# Patient Record
Sex: Female | Born: 1965 | Race: Black or African American | Hispanic: No | Marital: Single | Smoking: Former smoker
Health system: Southern US, Community
[De-identification: ages and names within clinical notes are randomized; demographics above are authoritative.]

## PROBLEM LIST (undated history)

## (undated) DIAGNOSIS — K3580 Unspecified acute appendicitis: Secondary | ICD-10-CM

---

## 2005-08-14 ENCOUNTER — Emergency Department (HOSPITAL_COMMUNITY): Admission: EM | Admit: 2005-08-14 | Discharge: 2005-08-14 | Payer: Self-pay | Admitting: Emergency Medicine

## 2005-11-04 ENCOUNTER — Ambulatory Visit (HOSPITAL_COMMUNITY): Admission: RE | Admit: 2005-11-04 | Discharge: 2005-11-04 | Payer: Self-pay | Admitting: Obstetrics

## 2005-11-09 ENCOUNTER — Ambulatory Visit (HOSPITAL_COMMUNITY): Admission: RE | Admit: 2005-11-09 | Discharge: 2005-11-09 | Payer: Self-pay | Admitting: Obstetrics

## 2005-11-18 ENCOUNTER — Encounter: Admission: RE | Admit: 2005-11-18 | Discharge: 2005-11-18 | Payer: Self-pay | Admitting: Obstetrics

## 2007-02-22 ENCOUNTER — Emergency Department (HOSPITAL_COMMUNITY): Admission: EM | Admit: 2007-02-22 | Discharge: 2007-02-22 | Payer: Self-pay | Admitting: Emergency Medicine

## 2008-08-29 ENCOUNTER — Emergency Department (HOSPITAL_COMMUNITY): Admission: EM | Admit: 2008-08-29 | Discharge: 2008-08-29 | Payer: Self-pay | Admitting: Emergency Medicine

## 2009-10-01 ENCOUNTER — Emergency Department (HOSPITAL_COMMUNITY): Admission: EM | Admit: 2009-10-01 | Discharge: 2009-10-01 | Payer: Self-pay | Admitting: Emergency Medicine

## 2009-12-13 ENCOUNTER — Emergency Department (HOSPITAL_COMMUNITY)
Admission: EM | Admit: 2009-12-13 | Discharge: 2009-12-13 | Payer: Self-pay | Source: Home / Self Care | Admitting: Emergency Medicine

## 2010-11-24 ENCOUNTER — Other Ambulatory Visit: Payer: Self-pay | Admitting: Obstetrics

## 2010-11-24 DIAGNOSIS — Z1231 Encounter for screening mammogram for malignant neoplasm of breast: Secondary | ICD-10-CM

## 2010-11-26 ENCOUNTER — Other Ambulatory Visit: Payer: Self-pay | Admitting: Gynecology

## 2010-11-26 DIAGNOSIS — Z1231 Encounter for screening mammogram for malignant neoplasm of breast: Secondary | ICD-10-CM

## 2010-12-17 ENCOUNTER — Ambulatory Visit
Admission: RE | Admit: 2010-12-17 | Discharge: 2010-12-17 | Disposition: A | Discharge status: Home / Self Care | Payer: BC Managed Care – PPO | Source: Ambulatory Visit | Attending: Gynecology | Admitting: Gynecology

## 2010-12-17 DIAGNOSIS — Z1231 Encounter for screening mammogram for malignant neoplasm of breast: Secondary | ICD-10-CM

## 2011-03-16 ENCOUNTER — Encounter: Payer: Self-pay | Admitting: Gynecology

## 2011-03-16 ENCOUNTER — Ambulatory Visit (INDEPENDENT_AMBULATORY_CARE_PROVIDER_SITE_OTHER): Payer: BC Managed Care – PPO | Admitting: Gynecology

## 2011-03-16 VITALS — BP 126/74 | Ht 63.0 in | Wt 217.0 lb

## 2011-03-16 DIAGNOSIS — N946 Dysmenorrhea, unspecified: Secondary | ICD-10-CM

## 2011-03-16 DIAGNOSIS — N920 Excessive and frequent menstruation with regular cycle: Secondary | ICD-10-CM

## 2011-03-16 DIAGNOSIS — D259 Leiomyoma of uterus, unspecified: Secondary | ICD-10-CM

## 2011-03-16 MED ORDER — CLINDAMYCIN PHOSPHATE 2 % VA CREA: 1.0000 | TOPICAL_CREAM | Freq: Every day | VAGINAL | Status: AC

## 2011-03-16 NOTE — Progress Notes (Addendum)
April Reed is an 46 y.o. female. Referred to our practice as a courtesy of Dr. Nicholas Lose. 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 Her symptoms have been heavy periods tiredness fatigue and pelvic pressure. She has had fibroids since 1992 and recently her symptoms of worsen that her periods last approximately 10 days at least and she has to be changing pads every 2 hours. Her recent hemoglobin and hematocrit on January 31 was as follows: Hemoglobin 13.6 and hematocrit 40.9 with a platelet count of 309,000. She had a recent FSH normal at 6.9. GC and Chlamydia culture done October of 2012 normal. Pap smear October 2012 normal with exception of yeast. Last mammogram November 2012 normal patient was treated in November for bacterial vaginosis. Several weeks ago by Dr. Nicholas Lose office she was 3 screening there was no evidence of BV.  Ultrasound done at Dr. Nicholas Lose office had demonstrated a fibroid uterus with transmural fibroid 1 with grade 3 submucous extending well into endometrial cavity and she had multiple small fibroids both intramural and serosal. He was unsuccessful and attempting a sonohysterogram do to the submucous myoma encroaching into the intrauterine cavity. She is here today for preoperative consultation for planned hysterectomy.    Pertinent Gynecological History: Menses: Heavy periods lasting up to 7-10 days changing pads every 2 hours Bleeding: As above Contraception: none DES exposure: denies Blood transfusions: none Sexually transmitted diseases: no past history Previous GYN Procedures: C-section  Last mammogram: normal Date: 2012 Last pap: normal Date: 2012 OB History: G3, P2 Ab1   Menstrual History: Menarche age: 9 No LMP recorded. Period Cycle (Days): 24  (24-28) Period Duration (Days): 7-10 Period Pattern: Regular Menstrual Flow: Heavy Menstrual Control: Maxi pad Menstrual Control Change Freq (Hours): q27mins on the heaviest  day Dysmenorrhea: Severe Dysmenorrhea Symptoms: Cramping;Headache (achiness before period )  History reviewed. No pertinent past medical history.  Past Surgical History  Procedure Date  . Cesarean section 1989    Family History  Problem Relation Age of Onset  . Hypertension Maternal Grandmother   . Diabetes Other     Social History:  reports that she has quit smoking. She has never used smokeless tobacco. She reports that she drinks alcohol. She reports that she does not use illicit drugs.  Allergies:  Allergies  Allergen Reactions  . Latex      (Not in a hospital admission)  @ROS @  Blood pressure 126/74, height 5\' 3"  (1.6 m), weight 217 lb (98.431 kg).  Physical Exam:  HEENT:unremarkable Neck:Supple, midline, no thyroid megaly, no carotid bruits Lungs:  Clear to auscultation no rhonchi's or wheezes Heart:Regular rate and rhythm, no murmurs or gallops Breast Exam: Not examined Abdomen: Soft nontender no rebound or guarding Pelvic:BUS within normal limits Vagina: No lesions or discharge Cervix: No lesions or discharge Uterus: 10 weeks size irregular shaped Adnexa: No palpable adnexal masses Extremities: No cords, no edema Rectal: Not done  No results found for this or any previous visit (from the past 24 hour(s)).  No results found.  Assessment/Plan: 46 year old gravida 3 para 2 AB 1 with symptomatic leiomyomatous uteri was counseled today for a total laparoscopic hysterectomy with ovarian conservation. The risks benefits and pros and cons of the operation were discussed to include the following: The risk of infection (prophylaxis antibiotic will be administered), the risk of DVT and subsequent pulmonary embolism (PAS stockings for prophylaxis will be administered), the risk of hemorrhaging and needing blood products or blood transfusion as the risk of anaphylactic reaction hepatitis  and AIDS. The risk of trauma or injury to internal structures requiring emergency  laparotomy and repair were discussed. Also in the event of any technical difficulty the laparoscopic approach may need to be converted to an open laparotomy procedure. If there is any abnormality of one or both ovaries which requires removal patient is full authorization but all efforts will be made to leave both ovaries. All the above were discussed in detail with the patient and literature information was provided on the procedure and all questions were answered. I have asked her to use Cleocin vaginal cream to apply intravaginally each bedtime for 5 days before her surgery.   FERNANDEZ,JUAN H 03/16/2011, 5:20 PM

## 2011-03-16 NOTE — Patient Instructions (Signed)
April Reed we'll be calling her tomorrow to let's know the date of the surgery. I would like you to use the Cleocin vaginal cream to apply it intravaginally for 5 nights before her surgery.

## 2011-03-19 ENCOUNTER — Encounter (HOSPITAL_COMMUNITY): Payer: Self-pay

## 2011-03-21 ENCOUNTER — Telehealth: Payer: Self-pay

## 2011-03-21 NOTE — Telephone Encounter (Signed)
Patient did not want to reschedule now. She is heading for [place] and said she how long she will stay there.   She has  asked me to cancel and she will call me upon her return to reschedule.

## 2011-03-21 NOTE — Telephone Encounter (Signed)
Please reschedule her TL H4 times a week and being for her as well. Please give her our condolences and tell her that our thoughts and prayers are with her and her family

## 2011-03-21 NOTE — Telephone Encounter (Signed)
Patient called and asked me to call her. She needs to reschedule her TLH that was recently scheduled for 03/31/2022. Her mother has died and she is on her way to [place].  I will call her to reschedule and send you new date.

## 2011-03-25 ENCOUNTER — Other Ambulatory Visit (HOSPITAL_COMMUNITY): Payer: BC Managed Care – PPO

## 2011-03-28 ENCOUNTER — Encounter (HOSPITAL_COMMUNITY): Admission: RE | Payer: Self-pay | Source: Ambulatory Visit

## 2011-03-28 ENCOUNTER — Ambulatory Visit (HOSPITAL_COMMUNITY): Admission: RE | Admit: 2011-03-28 | Payer: BC Managed Care – PPO | Source: Ambulatory Visit | Admitting: Gynecology

## 2011-03-28 SURGERY — HYSTERECTOMY, TOTAL, LAPAROSCOPIC
Anesthesia: General

## 2011-04-06 ENCOUNTER — Encounter (HOSPITAL_COMMUNITY)
Admission: RE | Admit: 2011-04-06 | Discharge: 2011-04-06 | Disposition: A | Discharge status: Home / Self Care | Payer: BC Managed Care – PPO | Source: Ambulatory Visit | Attending: Gynecology | Admitting: Gynecology

## 2011-04-06 ENCOUNTER — Encounter (HOSPITAL_COMMUNITY): Payer: Self-pay

## 2011-04-06 LAB — URINALYSIS, ROUTINE W REFLEX MICROSCOPIC
Ketones, ur: NEGATIVE mg/dL
Leukocytes, UA: NEGATIVE
Nitrite: NEGATIVE
Protein, ur: NEGATIVE mg/dL
Urobilinogen, UA: 0.2 mg/dL (ref 0.0–1.0)

## 2011-04-06 LAB — CBC
Hemoglobin: 13.8 g/dL (ref 12.0–15.0)
MCH: 30.8 pg (ref 26.0–34.0)
MCHC: 32.9 g/dL (ref 30.0–36.0)
Platelets: 306 10*3/uL (ref 150–400)
RDW: 12.9 % (ref 11.5–15.5)

## 2011-04-06 LAB — SURGICAL PCR SCREEN: MRSA, PCR: NEGATIVE

## 2011-04-06 NOTE — Patient Instructions (Addendum)
[name]  04/06/2011   Your procedure is scheduled on:  04/13/11  Enter through the Main Entrance of [facility] at 1130 AM.  Pick up the phone at the desk and dial [phone].   Call this number if you have problems the morning of surgery: [phone]   Remember:   Do not eat food:After Midnight.  Do not drink clear liquids: 4 Hours before arrival.  Take these medicines the morning of surgery with A SIP OF WATER: May take Xanax if needed   Do not wear jewelry, make-up or nail polish.  Do not wear lotions, powders, or perfumes. You may wear deodorant.  Do not shave 48 hours prior to surgery.  Do not bring valuables to the hospital.  Contacts, dentures or bridgework may not be worn into surgery.  Leave suitcase in the car. After surgery it may be brought to your room.  For patients admitted to the hospital, checkout time is 11:00 AM the day of discharge.   Patients discharged the day of surgery will not be allowed to drive home.  Name and phone number of your driver: NA  Special Instructions: CHG Shower Use Special Wash: 1/2 bottle night before surgery and 1/2 bottle morning of surgery.   Please read over the following fact sheets that you were given: MRSA Information

## 2011-04-12 ENCOUNTER — Telehealth: Payer: Self-pay

## 2011-04-12 MED ORDER — CEFAZOLIN SODIUM-DEXTROSE 2-3 GM-% IV SOLR
2.0000 g | INTRAVENOUS | Status: AC
  Administered 2011-04-13: 2 g via INTRAVENOUS
  Filled 2011-04-12: qty 50

## 2011-04-12 NOTE — Telephone Encounter (Signed)
Dr. Glenetta Hew asked me to call and see if any way patient could come over today and have u/s.  Her surgery is tomorrow and he realized her last scan was 2 mos ago and thought he would like to take another look at her fibroids.  Arna Medici she could fit her in at 12:30 or 1:00pm and I relayed this to the patient.  The patient said so much today at work today in order to be out for surgery that no way that she can leave today.  I told her Dr. Glenetta Hew said it would be okay if she could not. She cannot work it out.

## 2011-04-13 ENCOUNTER — Ambulatory Visit (HOSPITAL_COMMUNITY): Payer: BC Managed Care – PPO | Admitting: Anesthesiology

## 2011-04-13 ENCOUNTER — Encounter (HOSPITAL_COMMUNITY): Payer: Self-pay | Admitting: *Deleted

## 2011-04-13 ENCOUNTER — Encounter (HOSPITAL_COMMUNITY): Payer: Self-pay | Admitting: Anesthesiology

## 2011-04-13 ENCOUNTER — Encounter (HOSPITAL_COMMUNITY)
Admission: RE | Disposition: A | Discharge status: Home / Self Care | Payer: Self-pay | Source: Ambulatory Visit | Attending: Gynecology

## 2011-04-13 ENCOUNTER — Inpatient Hospital Stay (HOSPITAL_COMMUNITY)
Admission: RE | Admit: 2011-04-13 | Discharge: 2011-04-14 | DRG: 359 | Disposition: A | Discharge status: Home / Self Care | Payer: BC Managed Care – PPO | Source: Ambulatory Visit | Attending: Gynecology | Admitting: Gynecology

## 2011-04-13 DIAGNOSIS — N8000 Endometriosis of the uterus, unspecified: Secondary | ICD-10-CM | POA: Diagnosis present

## 2011-04-13 DIAGNOSIS — D259 Leiomyoma of uterus, unspecified: Secondary | ICD-10-CM

## 2011-04-13 DIAGNOSIS — Z01812 Encounter for preprocedural laboratory examination: Secondary | ICD-10-CM

## 2011-04-13 DIAGNOSIS — N840 Polyp of corpus uteri: Secondary | ICD-10-CM | POA: Diagnosis present

## 2011-04-13 DIAGNOSIS — N946 Dysmenorrhea, unspecified: Secondary | ICD-10-CM | POA: Diagnosis present

## 2011-04-13 DIAGNOSIS — Z01818 Encounter for other preprocedural examination: Secondary | ICD-10-CM

## 2011-04-13 DIAGNOSIS — N920 Excessive and frequent menstruation with regular cycle: Secondary | ICD-10-CM

## 2011-04-13 HISTORY — PX: LAPAROSCOPIC HYSTERECTOMY: SHX1926

## 2011-04-13 LAB — PREGNANCY, URINE: Preg Test, Ur: NEGATIVE

## 2011-04-13 SURGERY — HYSTERECTOMY, TOTAL, LAPAROSCOPIC
Anesthesia: General | Site: Abdomen | Wound class: Clean Contaminated

## 2011-04-13 MED ORDER — ONDANSETRON HCL 4 MG/2ML IJ SOLN
INTRAMUSCULAR | Status: DC | PRN
  Administered 2011-04-13: 4 mg via INTRAVENOUS

## 2011-04-13 MED ORDER — DIPHENHYDRAMINE HCL 50 MG/ML IJ SOLN: 12.5000 mg | Freq: Four times a day (QID) | INTRAMUSCULAR | Status: DC | PRN

## 2011-04-13 MED ORDER — GLYCOPYRROLATE 0.2 MG/ML IJ SOLN
INTRAMUSCULAR | Status: DC | PRN
  Administered 2011-04-13: 0.6 mg via INTRAVENOUS

## 2011-04-13 MED ORDER — MORPHINE SULFATE (PF) 1 MG/ML IV SOLN
INTRAVENOUS | Status: DC
  Administered 2011-04-13: 16.9 mg via INTRAVENOUS
  Administered 2011-04-13: 17:00:00 via INTRAVENOUS
  Administered 2011-04-14: 13.5 mg via INTRAVENOUS
  Administered 2011-04-14: 8.71 mg via INTRAVENOUS
  Administered 2011-04-14: 01:00:00 via INTRAVENOUS
  Filled 2011-04-13 (×2): qty 25

## 2011-04-13 MED ORDER — KETOROLAC TROMETHAMINE 60 MG/2ML IM SOLN
INTRAMUSCULAR | Status: AC
  Filled 2011-04-13: qty 2

## 2011-04-13 MED ORDER — BUPIVACAINE HCL (PF) 0.25 % IJ SOLN
INTRAMUSCULAR | Status: DC | PRN
  Administered 2011-04-13: 10 mL

## 2011-04-13 MED ORDER — FENTANYL CITRATE 0.05 MG/ML IJ SOLN
INTRAMUSCULAR | Status: AC
  Filled 2011-04-13: qty 2

## 2011-04-13 MED ORDER — PHENYLEPHRINE 40 MCG/ML (10ML) SYRINGE FOR IV PUSH (FOR BLOOD PRESSURE SUPPORT)
PREFILLED_SYRINGE | INTRAVENOUS | Status: AC
  Filled 2011-04-13: qty 5

## 2011-04-13 MED ORDER — DEXAMETHASONE SODIUM PHOSPHATE 4 MG/ML IJ SOLN
INTRAMUSCULAR | Status: DC | PRN
  Administered 2011-04-13: 10 mg via INTRAVENOUS

## 2011-04-13 MED ORDER — DIPHENHYDRAMINE HCL 12.5 MG/5ML PO ELIX: 12.5000 mg | ORAL_SOLUTION | Freq: Four times a day (QID) | ORAL | Status: DC | PRN

## 2011-04-13 MED ORDER — PROPOFOL 10 MG/ML IV EMUL
INTRAVENOUS | Status: DC | PRN
  Administered 2011-04-13: 200 mg via INTRAVENOUS

## 2011-04-13 MED ORDER — GLYCOPYRROLATE 0.2 MG/ML IJ SOLN
INTRAMUSCULAR | Status: AC
  Filled 2011-04-13: qty 1

## 2011-04-13 MED ORDER — ONDANSETRON HCL 4 MG/2ML IJ SOLN: 4.0000 mg | Freq: Four times a day (QID) | INTRAMUSCULAR | Status: DC | PRN

## 2011-04-13 MED ORDER — KETOROLAC TROMETHAMINE 30 MG/ML IJ SOLN
INTRAMUSCULAR | Status: DC | PRN
  Administered 2011-04-13: 30 mg via INTRAVENOUS

## 2011-04-13 MED ORDER — LACTATED RINGERS IV SOLN
INTRAVENOUS | Status: DC
  Administered 2011-04-13 – 2011-04-14 (×2): via INTRAVENOUS

## 2011-04-13 MED ORDER — HYDROMORPHONE HCL PF 1 MG/ML IJ SOLN
0.2500 mg | INTRAMUSCULAR | Status: DC | PRN
  Administered 2011-04-13 (×3): 0.5 mg via INTRAVENOUS

## 2011-04-13 MED ORDER — LIDOCAINE HCL (CARDIAC) 20 MG/ML IV SOLN
INTRAVENOUS | Status: AC
  Filled 2011-04-13: qty 5

## 2011-04-13 MED ORDER — LACTATED RINGERS IR SOLN
Status: DC | PRN
  Administered 2011-04-13: 3000 mL

## 2011-04-13 MED ORDER — LIDOCAINE HCL (CARDIAC) 20 MG/ML IV SOLN
INTRAVENOUS | Status: DC | PRN
  Administered 2011-04-13: 100 mg via INTRAVENOUS

## 2011-04-13 MED ORDER — SODIUM CHLORIDE 0.9 % IJ SOLN: 9.0000 mL | INTRAMUSCULAR | Status: DC | PRN

## 2011-04-13 MED ORDER — DEXAMETHASONE SODIUM PHOSPHATE 10 MG/ML IJ SOLN
INTRAMUSCULAR | Status: AC
  Filled 2011-04-13: qty 1

## 2011-04-13 MED ORDER — HYDROMORPHONE HCL PF 1 MG/ML IJ SOLN
INTRAMUSCULAR | Status: AC
  Filled 2011-04-13: qty 1

## 2011-04-13 MED ORDER — MIDAZOLAM HCL 5 MG/5ML IJ SOLN
INTRAMUSCULAR | Status: DC | PRN
  Administered 2011-04-13: 2 mg via INTRAVENOUS

## 2011-04-13 MED ORDER — INDIGOTINDISULFONATE SODIUM 8 MG/ML IJ SOLN
INTRAMUSCULAR | Status: AC
  Filled 2011-04-13: qty 5

## 2011-04-13 MED ORDER — PHENYLEPHRINE HCL 10 MG/ML IJ SOLN
INTRAMUSCULAR | Status: DC | PRN
  Administered 2011-04-13: .4 mg via INTRAVENOUS

## 2011-04-13 MED ORDER — NEOSTIGMINE METHYLSULFATE 1 MG/ML IJ SOLN
INTRAMUSCULAR | Status: AC
  Filled 2011-04-13: qty 10

## 2011-04-13 MED ORDER — FENTANYL CITRATE 0.05 MG/ML IJ SOLN
INTRAMUSCULAR | Status: DC | PRN
  Administered 2011-04-13: 50 ug via INTRAVENOUS
  Administered 2011-04-13: 25 ug via INTRAVENOUS
  Administered 2011-04-13: 100 ug via INTRAVENOUS
  Administered 2011-04-13 (×3): 50 ug via INTRAVENOUS
  Administered 2011-04-13: 25 ug via INTRAVENOUS
  Administered 2011-04-13 (×2): 50 ug via INTRAVENOUS

## 2011-04-13 MED ORDER — ROCURONIUM BROMIDE 100 MG/10ML IV SOLN
INTRAVENOUS | Status: DC | PRN
  Administered 2011-04-13: 10 mg via INTRAVENOUS
  Administered 2011-04-13: 40 mg via INTRAVENOUS

## 2011-04-13 MED ORDER — HYDROMORPHONE HCL PF 1 MG/ML IJ SOLN
INTRAMUSCULAR | Status: AC
  Administered 2011-04-13: 0.5 mg via INTRAVENOUS
  Filled 2011-04-13: qty 1

## 2011-04-13 MED ORDER — LACTATED RINGERS IV SOLN
INTRAVENOUS | Status: DC
  Administered 2011-04-13 (×3): via INTRAVENOUS

## 2011-04-13 MED ORDER — BUPIVACAINE HCL (PF) 0.25 % IJ SOLN
INTRAMUSCULAR | Status: AC
  Filled 2011-04-13: qty 30

## 2011-04-13 MED ORDER — ONDANSETRON HCL 4 MG/2ML IJ SOLN
INTRAMUSCULAR | Status: AC
  Filled 2011-04-13: qty 2

## 2011-04-13 MED ORDER — NEOSTIGMINE METHYLSULFATE 1 MG/ML IJ SOLN
INTRAMUSCULAR | Status: DC | PRN
  Administered 2011-04-13: 4 mg via INTRAVENOUS

## 2011-04-13 MED ORDER — NALOXONE HCL 0.4 MG/ML IJ SOLN: 0.4000 mg | INTRAMUSCULAR | Status: DC | PRN

## 2011-04-13 MED ORDER — MIDAZOLAM HCL 2 MG/2ML IJ SOLN
INTRAMUSCULAR | Status: AC
  Filled 2011-04-13: qty 2

## 2011-04-13 MED ORDER — FENTANYL CITRATE 0.05 MG/ML IJ SOLN
INTRAMUSCULAR | Status: AC
  Filled 2011-04-13: qty 5

## 2011-04-13 MED ORDER — KETOROLAC TROMETHAMINE 30 MG/ML IJ SOLN: 15.0000 mg | Freq: Once | INTRAMUSCULAR | Status: DC | PRN

## 2011-04-13 SURGICAL SUPPLY — 55 items
BLADE SURG 15 STRL LF C SS BP (BLADE) ×1 IMPLANT
BLADE SURG 15 STRL SS (BLADE) ×1
CABLE HIGH FREQUENCY MONO STRZ (ELECTRODE) ×2 IMPLANT
CLOTH BEACON ORANGE TIMEOUT ST (SAFETY) ×2 IMPLANT
CONT PATH 16OZ SNAP LID 3702 (MISCELLANEOUS) IMPLANT
COVER MAYO STAND STRL (DRAPES) ×2 IMPLANT
COVER TABLE BACK 60X90 (DRAPES) ×2 IMPLANT
DERMABOND ADVANCED (GAUZE/BANDAGES/DRESSINGS) ×1
DERMABOND ADVANCED .7 DNX12 (GAUZE/BANDAGES/DRESSINGS) ×1 IMPLANT
DEVICE SUTURE ENDOST 10MM (ENDOMECHANICALS) ×10 IMPLANT
DISSECTOR BLUNT TIP ENDO 5MM (MISCELLANEOUS) IMPLANT
DRAPE CAMERA CLOSED 9X96 (DRAPES) IMPLANT
ENDOSTITCH 0 SINGLE 48 (SUTURE) ×6 IMPLANT
EVACUATOR SMOKE 8.L (FILTER) ×2 IMPLANT
GLOVE BIO SURGEON STRL SZ7.5 (GLOVE) ×2 IMPLANT
GLOVE BIOGEL PI IND STRL 6.5 (GLOVE) IMPLANT
GLOVE BIOGEL PI IND STRL 8 (GLOVE) ×1 IMPLANT
GLOVE BIOGEL PI INDICATOR 6.5 (GLOVE)
GLOVE BIOGEL PI INDICATOR 8 (GLOVE) ×1
GLOVE ECLIPSE 7.5 STRL STRAW (GLOVE) ×4 IMPLANT
GOWN PREVENTION PLUS LG XLONG (DISPOSABLE) ×4 IMPLANT
NS IRRIG 1000ML POUR BTL (IV SOLUTION) ×2 IMPLANT
OCCLUDER COLPOPNEUMO (BALLOONS) ×2 IMPLANT
PACK LAVH (CUSTOM PROCEDURE TRAY) ×2 IMPLANT
SCALPEL HARMONIC ACE (MISCELLANEOUS) ×2 IMPLANT
SCISSORS LAP 5X35 DISP (ENDOMECHANICALS) ×2 IMPLANT
SET CYSTO W/LG BORE CLAMP LF (SET/KITS/TRAYS/PACK) IMPLANT
SET IRRIG TUBING LAPAROSCOPIC (IRRIGATION / IRRIGATOR) ×2 IMPLANT
SUT PLAIN 3 0 PS2 27 (SUTURE) IMPLANT
SUT VIC AB 0 CT1 18XCR BRD8 (SUTURE) IMPLANT
SUT VIC AB 0 CT1 27 (SUTURE)
SUT VIC AB 0 CT1 27XBRD ANBCTR (SUTURE) IMPLANT
SUT VIC AB 0 CT1 8-18 (SUTURE)
SUT VIC AB 2-0 SH 27 (SUTURE)
SUT VIC AB 2-0 SH 27XBRD (SUTURE) IMPLANT
SUT VIC AB 3-0 CT1 27 (SUTURE)
SUT VIC AB 3-0 CT1 TAPERPNT 27 (SUTURE) IMPLANT
SUT VIC AB 3-0 SH 27 (SUTURE)
SUT VIC AB 3-0 SH 27X BRD (SUTURE) IMPLANT
SUT VICRYL 0 TIES 12 18 (SUTURE) IMPLANT
SUT VICRYL 0 UR6 27IN ABS (SUTURE) ×4 IMPLANT
SUT VICRYL RAPIDE 3 0 (SUTURE) ×4 IMPLANT
SYR 50ML LL SCALE MARK (SYRINGE) ×2 IMPLANT
SYR BULB IRRIGATION 50ML (SYRINGE) ×2 IMPLANT
TIP UTERINE 5.1X6CM LAV DISP (MISCELLANEOUS) IMPLANT
TIP UTERINE 6.7X10CM GRN DISP (MISCELLANEOUS) IMPLANT
TIP UTERINE 6.7X6CM WHT DISP (MISCELLANEOUS) ×2 IMPLANT
TIP UTERINE 6.7X8CM BLUE DISP (MISCELLANEOUS) ×2 IMPLANT
TOWEL OR 17X24 6PK STRL BLUE (TOWEL DISPOSABLE) ×4 IMPLANT
TRAY FOLEY CATH 14FR (SET/KITS/TRAYS/PACK) ×2 IMPLANT
TROCAR BALLN 12MMX100 BLUNT (TROCAR) IMPLANT
TROCAR XCEL NON-BLD 11X100MML (ENDOMECHANICALS) ×4 IMPLANT
TROCAR XCEL NON-BLD 5MMX100MML (ENDOMECHANICALS) ×4 IMPLANT
WARMER LAPAROSCOPE (MISCELLANEOUS) ×2 IMPLANT
WATER STERILE IRR 1000ML POUR (IV SOLUTION) ×2 IMPLANT

## 2011-04-13 NOTE — Anesthesia Postprocedure Evaluation (Signed)
Anesthesia Post Note  Patient: April Reed  Procedure(s) Performed: Procedure(s) (LRB): HYSTERECTOMY TOTAL LAPAROSCOPIC (N/A)  Anesthesia type: General  Patient location: PACU  Post pain: Pain level controlled  Post assessment: Post-op Vital signs reviewed  Last Vitals:  Filed Vitals:   04/13/11 1714  BP: 147/84  Pulse: 73  Temp: 36.5 C  Resp: 18    Post vital signs: Reviewed  Level of consciousness: sedated  Complications: No apparent anesthesia complications

## 2011-04-13 NOTE — H&P (View-Only) (Signed)
April Reed is an 46 y.o. female. Referred to our practice as a courtesy of Dr. Nicholas Reed. 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 Her symptoms have been heavy periods tiredness fatigue and pelvic pressure. She has had fibroids since 1992 and recently her symptoms of worsen that her periods last oxylate 10 days at least and she has to be changing pads every 2 hours. Her recent hemoglobin and hematocrit on January 31 was as follows: Hemoglobin 13.6 and hematocrit 40.9 with a platelet count of 309,000. She had a recent FSH normal at 6.9. GC and Chlamydia culture done October of 2012 normal. Pap smear October 2012 normal with exception of yeast. Last mammogram November 2012 normal patient was treated in November for bacterial vaginosis. Several weeks ago by Dr. Nicholas Reed office she was 3 screening there was no evidence of BV.  Ultrasound done at Dr. Nicholas Reed office had demonstrated a fibroid uterus with transmural fibroid 1 with grade 3 submucous extending well into endometrial cavity and she had multiple small fibroids both intramural and serosal. He was unsuccessful and attempting a sonohysterogram do to the submucous myoma encroaching into the intrauterine cavity. She is here today for preoperative consultation for planned hysterectomy.    Pertinent Gynecological History: Menses: Heavy periods lasting up to 7-10 days changing pads every 2 hours Bleeding: As above Contraception: none DES exposure: denies Blood transfusions: none Sexually transmitted diseases: no past history Previous GYN Procedures: C-section  Last mammogram: normal Date: 2012 Last pap: normal Date: 2012 OB History: G3, P2 Ab1   Menstrual History: Menarche age: 36 No LMP recorded. Period Cycle (Days): 24  (24-28) Period Duration (Days): 7-10 Period Pattern: Regular Menstrual Flow: Heavy Menstrual Control: Maxi pad Menstrual Control Change Freq (Hours): q2mins on the heaviest day Dysmenorrhea:  Severe Dysmenorrhea Symptoms: Cramping;Headache (achiness before period )  History reviewed. No pertinent past medical history.  Past Surgical History  Procedure Date  . Cesarean section 1989    Family History  Problem Relation Age of Onset  . Hypertension Maternal Grandmother   . Diabetes Other     Social History:  reports that she has quit smoking. She has never used smokeless tobacco. She reports that she drinks alcohol. She reports that she does not use illicit drugs.  Allergies:  Allergies  Allergen Reactions  . Latex      (Not in a hospital admission)  @ROS @  Blood pressure 126/74, height 5\' 3"  (1.6 m), weight 217 lb (98.431 kg).  Physical Exam:  HEENT:unremarkable Neck:Supple, midline, no thyroid megaly, no carotid bruits Lungs:  Clear to auscultation no rhonchi's or wheezes Heart:Regular rate and rhythm, no murmurs or gallops Breast Exam: Not examined Abdomen: Soft nontender no rebound or guarding Pelvic:BUS within normal limits Vagina: No lesions or discharge Cervix: No lesions or discharge Uterus: 10 weeks size irregular shaped Adnexa: No palpable adnexal masses Extremities: No cords, no edema Rectal: Not done  No results found for this or any previous visit (from the past 24 hour(s)).  No results found.  Assessment/Plan: 46 year old gravida 3 para 2 AB 1 with symptomatic leiomyomatous uteri was counseled today for a total laparoscopic hysterectomy with ovarian conservation. The risks benefits and pros and cons of the operation were discussed to include the following: The risk of infection (prophylaxis antibiotic will be administered), the risk of DVT and subsequent pulmonary embolism (PAS stockings for prophylaxis will be administered), the risk of hemorrhaging and needing blood products or blood transfusion as the risk of anaphylactic reaction hepatitis  and AIDS. The risk of trauma or injury to internal structures requiring emergency laparotomy and  repair were discussed. Also in the event of any technical difficulty the laparoscopic approach may need to be converted to an open laparotomy procedure. If there is any abnormality of one or both ovaries which requires removal patient is full authorization but all efforts will be made to leave both ovaries. All the above were discussed in detail with the patient and literature information was provided on the procedure and all questions were answered. I have asked her to use Cleocin vaginal cream to apply intravaginally each bedtime for 5 days before her surgery.   April Reed H 03/16/2011, 5:20 PM

## 2011-04-13 NOTE — Op Note (Signed)
04/13/2011  3:35 PM  PATIENT:  April Reed  46 y.o. female  PRE-OPERATIVE DIAGNOSIS:  SYMPTOMATIC FIBROIDS  POST-OPERATIVE DIAGNOSIS:  Symptomatic fibroids  PROCEDURE:  Procedure(s): HYSTERECTOMY TOTAL LAPAROSCOPIC  SURGEON:  Surgeon(s): Ok Edwards, MD Dara Lords, MD  ANESTHESIA:   general  FINDINGS: Leiomyomata uteri, normal appearing tubes and ovaries  DESCRIPTION OF OPERATION: The patient was taken to the operating room where she underwent successful general endotracheal anesthesia. Patient received a gram of Cefotan preoperatively and then she was placed in the high lithotomy position and her abdomen vagina and perineum were prepped and draped in usual sterile fashion. A weighted speculum was placed in the posterior vaginal vault and a rigby retractor was placed for exposure. The anterior cervical lip was grasped with a single-tooth tenaculum. The uterus sounded to 9-1/2 cm. The Rhumy uterine manipulator tip of the appropriate size was attached along with the pneumo-occluder and the appropriate Koh cup (colpotomizer). The intrauterine balloon was filled with 10 cc of saline and the single-tooth tenaculum was removed after the Rhumy uterine manipulator was in placed. The Foley catheter was inserted to monitor urinary output during the operation. The patient's leg were then brought down to low lithotomy position. A small subumbilical incision was made to allow a 10/11 mm Optiview trocar. A pneumoperitoneum was established and approximately 2 to 3 L of carbon dioxide were insufflated. 2 additional ports were made with 5 mm trocar under laparoscopic guidance on the right and left lower abdomen. A systematic inspection of the pelvic cavity demonstrated a leiomyomatous uteri with normal appearing tubes and ovaries bilateral. Patient had smooth liver surface with partial visualization of the gallbladder, appendix not visualized. Attention was in place to the right adnexa where the  right utero-ovarian ligament was coaptated and transected with the harmonic scalpel. The proximal portion of the right fallopian tube was coaptated and transected with the harmonic scalpel as well. The round ligament was similarly coaptated and transected. The remainder of the broad and cardinal ligament were coaptated and transected to the level of the right fornix. The bladder flap was established cautiously and peeled off the lower uterine segment. The right uterine artery was identified and cauterized and transected with the harmonic scalpel. Similar procedure was carried out on the contralateral side. With the back side of the Harmonic Scalpel an incision was made into the anterior fornix and continued in a circumferential fashion to free the cervix from the fornices. The uterus and cervix were then brought out through the vagina and passed off the operative field and submitted for histological evaluation. A bulb syringe was placed into the vagina in an effort to establish a pneumoperitoneum. Attention was then placed on the vaginal cuff after the pelvic cavity was copiously irrigated with normal saline solution. The right lower port site was enlarged to allow a 10/11 mm trocar. The Endo stitch was brought into this port and the vaginal cuff was closed with interrupted figure-of-eight of 0 Vicryl suture. The pelvic cavity was once again copiously irrigated with normal saline solution and ascertaining adequate hemostasis. The pneumoperitoneum was released and continue better visualization of the vaginal cuff and pedicles appeared to be completely dry. The pneumoperitoneum was completely removed the instruments were then removed. The subumbilical port fascia was closed with a single interrupted suture of 0 Vicryl suture as was the right port. The skin was reapproximated with Dermabond glue. A 0.25 percent Marcaine was infiltrated on all 3 port site for a total of 10 cc.  The bulb syringe was removed and the  vagina. Patient received Toradol 30 mg IV in route to the recovery room. Patient was extubated and transferred to recovery room stable vital signs.  ESTIMATED BLOOD LOSS: 500 cc  Intake/Output Summary (Last 24 hours) at 04/13/11 1535 Last data filed at 04/13/11 1515  Gross per 24 hour  Intake   1000 ml  Output    900 ml  Net    100 ml     BLOOD ADMINISTERED:none   LOCAL MEDICATIONS USED:  MARCAINE   0.25% the subcutaneous laparoscopic ports for a total of 10 cc postop.  SPECIMEN:  Source of Specimen:  Uterus and cervix  DISPOSITION OF SPECIMEN:  PATHOLOGY  COUNTS:  YES  PLAN OF CARE: Transfer to PACU  [facility] HMD3:35 PMTD@

## 2011-04-13 NOTE — Interval H&P Note (Signed)
History and Physical Interval Note:  04/13/2011 12:37 PM  April Reed  has presented today for surgery, with the diagnosis of SYMPTOMATIC FIBROIDS  The various methods of treatment have been discussed with the patient and family. After consideration of risks, benefits and other options for treatment, the patient has consented to  Procedure(s) (LRB): HYSTERECTOMY TOTAL LAPAROSCOPIC (N/A) as a surgical intervention .  The patients' history has been reviewed, patient examined, no change in status, stable for surgery.  I have reviewed the patients' chart and labs.  Questions were answered to the patient's satisfaction.     Ok Edwards

## 2011-04-13 NOTE — Transfer of Care (Signed)
Immediate Anesthesia Transfer of Care Note  Patient: April Reed  Procedure(s) Performed: Procedure(s) (LRB): HYSTERECTOMY TOTAL LAPAROSCOPIC (N/A)  Patient Location: PACU  Anesthesia Type: General  Level of Consciousness: awake, alert  and oriented  Airway & Oxygen Therapy: Patient Spontanous Breathing and Patient connected to nasal cannula oxygen  Post-op Assessment: Report given to PACU RN and Post -op Vital signs reviewed and stable  Post vital signs: Reviewed and stable  Complications: No apparent anesthesia complications

## 2011-04-13 NOTE — Anesthesia Preprocedure Evaluation (Signed)
Anesthesia Evaluation  Patient identified by MRN, date of birth, ID band Patient awake    Reviewed: Allergy & Precautions, H&P , NPO status , Patient's Chart, lab work & pertinent test results, reviewed documented beta blocker date and time   History of Anesthesia Complications Negative for: history of anesthetic complications  Airway Mallampati: I      Dental  (+) Teeth Intact   Pulmonary asthma (last inhaler use 1 year ago, seasonal) , Current Smoker,  breath sounds clear to auscultation  Pulmonary exam normal       Cardiovascular Exercise Tolerance: Good negative cardio ROS  Rhythm:regular Rate:Normal     Neuro/Psych negative neurological ROS  negative psych ROS   GI/Hepatic negative GI ROS, Neg liver ROS,   Endo/Other  negative endocrine ROSobese  Renal/GU negative Renal ROS  negative genitourinary   Musculoskeletal   Abdominal   Peds  Hematology negative hematology ROS (+)   Anesthesia Other Findings   Reproductive/Obstetrics negative OB ROS                           Anesthesia Physical Anesthesia Plan  ASA: II  Anesthesia Plan: General ETT   Post-op Pain Management:    Induction:   Airway Management Planned:   Additional Equipment:   Intra-op Plan:   Post-operative Plan:   Informed Consent: I have reviewed the patients History and Physical, chart, labs and discussed the procedure including the risks, benefits and alternatives for the proposed anesthesia with the patient or authorized representative who has indicated his/her understanding and acceptance.   Dental Advisory Given  Plan Discussed with: CRNA and Surgeon  Anesthesia Plan Comments:         Anesthesia Quick Evaluation

## 2011-04-13 NOTE — Anesthesia Procedure Notes (Signed)
Procedure Name: Intubation Date/Time: 04/13/2011 12:54 PM Performed by: Rotha Cassels MARIE Pre-anesthesia Checklist: Patient identified, Patient being monitored, Emergency Drugs available, Timeout performed and Suction available Patient Re-evaluated:Patient Re-evaluated prior to inductionOxygen Delivery Method: Circle system utilized Preoxygenation: Pre-oxygenation with 100% oxygen Intubation Type: IV induction Ventilation: Mask ventilation without difficulty Laryngoscope Size: Mac and 4 Grade View: Grade II Tube size: 7.0 mm Number of attempts: 1 Dental Injury: Teeth and Oropharynx as per pre-operative assessment

## 2011-04-14 ENCOUNTER — Encounter (HOSPITAL_COMMUNITY): Payer: Self-pay | Admitting: Gynecology

## 2011-04-14 LAB — CBC
MCHC: 33 g/dL (ref 30.0–36.0)
MCV: 93.6 fL (ref 78.0–100.0)
RDW: 12.9 % (ref 11.5–15.5)
WBC: 6.5 10*3/uL (ref 4.0–10.5)

## 2011-04-14 MED ORDER — OXYCODONE-ACETAMINOPHEN 5-325 MG PO TABS
1.0000 | ORAL_TABLET | Freq: Four times a day (QID) | ORAL | Status: DC | PRN
  Administered 2011-04-14 (×2): 2 via ORAL
  Filled 2011-04-14 (×2): qty 2

## 2011-04-14 MED ORDER — LACTATED RINGERS IV SOLN: INTRAVENOUS | Status: DC

## 2011-04-14 MED ORDER — METOCLOPRAMIDE HCL 10 MG PO TABS: 10.0000 mg | ORAL_TABLET | Freq: Three times a day (TID) | ORAL | Status: AC

## 2011-04-14 MED ORDER — OXYCODONE-ACETAMINOPHEN 5-325 MG PO TABS: 1.0000 | ORAL_TABLET | Freq: Four times a day (QID) | ORAL | Status: AC | PRN

## 2011-04-14 NOTE — Progress Notes (Signed)
UR Chart review completed.  

## 2011-04-14 NOTE — Discharge Summary (Signed)
Physician Discharge Summary  Patient ID: April Reed MRN: [id number] DOB/AGE: 44-May-1967 44 y.o.  Admit date: 04/13/2011 Discharge date: 04/14/2011  Admission Diagnoses: Symptomatic leiomyomatous uteri, dysmenorrhea, menorrhagia  Discharge Diagnoses: Same Active Problems:  * No active hospital problems. *    Discharged Condition: good  Hospital Course: Patient was admitted to the hospital on March 6 where she underwent a total laparoscopic hysterectomy for symptomatic leiomyomatous uteri. Patient had a Foley catheter for approximately 20 hours was discontinued and she was voiding spontaneously. Her diet was gradually advanced to clear liquid to regular diet. Her postoperative hemoglobin was 11.6. Her vital signs remained stable and afebrile and she was rated be discharged home her second postoperative day. Her port sites were intact patient passed flatus.  Consults: None  Significant Diagnostic Studies: None  Treatments: surgery: Total laparoscopic hysterectomy  Discharge Exam: Blood pressure 111/68, pulse 81, temperature 98.1 F (36.7 C), temperature source Oral, resp. rate 16, height 5\' 3"  (1.6 m), weight 216 lb (97.977 kg), last menstrual period 03/28/2011, SpO2 95.00%. General appearance: alert and cooperative Resp: clear to auscultation bilaterally GI: soft, non-tender; bowel sounds normal; no masses,  no organomegaly Extremities: extremities normal, atraumatic, no cyanosis or edema  Disposition: Final discharge disposition not confirmed  Discharge Orders    Future Orders Please Complete By Expires   Resume previous diet      Driving Restrictions      Comments:   No driving for one week   Lifting restrictions      Comments:   No lifting for six weeks   Call MD for:  temperature >100.5      Call MD for:  redness, tenderness, or signs of infection (pain, swelling, bleeding, redness, odor or green/yellow discharge around incision site)      Call MD for:  severe or  increased pain, loss or decreased feeling  in affected limb(s)      Discharge instructions      Comments:   Call office tomorrow to schedule 2 weeks post op visit     Medication List  As of 04/14/2011  6:15 PM   STOP taking these medications         ALPRAZolam 0.5 MG tablet         TAKE these medications         metoCLOPramide 10 MG tablet   Commonly known as: REGLAN   Take 1 tablet (10 mg total) by mouth 3 (three) times daily with meals.      oxyCODONE-acetaminophen 5-325 MG per tablet   Commonly known as: PERCOCET   Take 1-2 tablets by mouth every 6 (six) hours as needed.            plan: Patient was discharged home with a prescription to take Percocet 5/325 mg 1 by mouth every 4-6 hours when necessary. Also she was given a prescription of Reglan to take one by mouth every 4-6 hours when necessary. Patient to call office tomorrow to schedule followup postop appointment in 2 weeks. Instruction sheet provided.  SignedOk Edwards 04/14/2011, 6:15 PM

## 2011-04-14 NOTE — Discharge Instructions (Signed)
General Postsurgical Instructions, Adult  You may expect to feel dizzy, weak, and drowsy for as long as 24 hours after receiving the medicine that made you sleep (anesthetic). The following information pertains to your recovery period for the first 24 hours following surgery.  Do not drive a car, ride a bicycle, participate in physical activities, or take public transportation until you are done taking narcotic pain medicines or as directed by your caregiver.   Do not drink alcohol or take tranquilizers.   Do not take medicine that has not been prescribed by your caregiver.   Do not sign important papers or make important decisions while on narcotic pain medicines.   Have a responsible person with you.  CARE OF INCISION  Change bandages (dressings) as directed.   Take showers instead of baths until your caregiver gives you permission to take baths. Check with your caregiver if you have tubes coming from the wound site.   Avoid heavy lifting (more than 10 pounds/4.5 kilograms), pushing, or pulling.   Avoid activities that may risk injury to your surgical site.   Only take over-the-counter or prescription medicines for pain, discomfort, or fever as directed by your caregiver. Do not take aspirin. It can make you bleed. Take medicines (antibiotics) that kill germs as directed.  SEEK MEDICAL CARE IF:  You feel sick to your stomach (nauseous).   You start to throw up (vomit).   You have trouble eating or drinking.   You have an oral temperature above 100.   You have constipation that is not helped by adjusting diet or increasing fluid intake. Pain medicines are a common cause of constipation.  SEEK IMMEDIATE MEDICAL CARE IF:  You have persistent dizziness.   You have difficulty breathing or a congested sounding (croupy) cough.   You have an oral temperature above 100, not controlled by medicine.   There is increasing pain or tenderness near or in the surgical site.  Document  Released: 05/08/2000 Document Re-Released: 04/20/2009 [facility] Patient Information 2011 [place], [place].   [facility] HMD6:12 PMTD@

## 2011-04-14 NOTE — Progress Notes (Signed)
1 Day Post-Op Procedure(s) (LRB): HYSTERECTOMY TOTAL LAPAROSCOPIC (N/A)  Subjective: Patient reports tolerating PO.    Objective: I have reviewed patient's vital signs, intake and output, medications and labs. Postop hemoglobin 11.6, urine output 1800 cc past 8 hours.  General: alert, cooperative and no distress Resp: clear to auscultation bilaterally Cardio: regular rate and rhythm, S1, S2 normal, no murmur, click, rub or gallop GI: soft, non-tender; bowel sounds normal; no masses,  no organomegaly Extremities: extremities normal, atraumatic, no cyanosis or edema  Assessment: s/p Procedure(s) (LRB): HYSTERECTOMY TOTAL LAPAROSCOPIC (N/A): stable, progressing well and tolerating diet  Plan: Advance diet Encourage ambulation Advance to PO medication Discontinue IV fluids We'll plan on discharging home late this afternoon. We'll DC PCA pump for later today and start by mouth pain medication.  LOS: 1 day    Damarie Schoolfield H 04/14/2011, 6:36 AM

## 2011-04-14 NOTE — Addendum Note (Signed)
Addendum  created 04/14/11 1610 by Cephus Shelling, CRNA   Modules edited:Notes Section

## 2011-04-14 NOTE — Anesthesia Postprocedure Evaluation (Signed)
  Anesthesia Post-op Note  Patient: April Reed  Procedure(s) Performed: Procedure(s) (LRB): HYSTERECTOMY TOTAL LAPAROSCOPIC (N/A)  Patient Location: [facility]  Anesthesia Type: General  Level of Consciousness: awake  Airway and Oxygen Therapy: Patient Spontanous Breathing  Post-op Pain: mild  Post-op Assessment: Post-op Vital signs reviewed  Post-op Vital Signs: Reviewed and stable  Complications: No apparent anesthesia complications

## 2011-04-15 ENCOUNTER — Telehealth: Payer: Self-pay | Admitting: *Deleted

## 2011-04-15 MED ORDER — IBUPROFEN 800 MG PO TABS: 800.0000 mg | ORAL_TABLET | Freq: Three times a day (TID) | ORAL | Status: AC | PRN

## 2011-04-15 NOTE — Telephone Encounter (Signed)
[facility] called stating that the pharmacy never got pt reglan 10 mg Rx. I called [facility] on [address] and they have pt Rx. They just didn't fill Rx yet. Rx will be filled and pt was told to call [facility] to check when rx ready.

## 2011-04-15 NOTE — Telephone Encounter (Signed)
Pt informed with the below note, rx sent to pharmacy. 

## 2011-04-15 NOTE — Telephone Encounter (Signed)
Pt had TLH done on 04/13/11 and given percocet for pain 1-2 tablets every 6 hours as needed, pt is taking 2 tablets. Pt said around the 4th or 5th hour she starts to hurt with sharp pains. Pt is asking if she could have something like ibuprofen 800 mg to take as needed also?  Please advise

## 2011-04-15 NOTE — Telephone Encounter (Deleted)
Message copied by Keenan Bachelor on Fri Apr 15, 2011 12:27 PM ------      Message from: Ok Edwards      Created: Fri Apr 15, 2011 12:19 PM       Please inform patient that her pathology report was benign.

## 2011-04-15 NOTE — Telephone Encounter (Signed)
Please call and Motrin 800 mg to take 1 by mouth 3 times a day when necessary #30 refill x2

## 2011-04-20 ENCOUNTER — Telehealth: Payer: Self-pay | Admitting: *Deleted

## 2011-04-20 MED ORDER — URIBEL 118 MG PO CAPS: 118.0000 mg | ORAL_CAPSULE | Freq: Four times a day (QID) | ORAL | Status: DC

## 2011-04-20 MED ORDER — CIPROFLOXACIN HCL 250 MG PO TABS: 250.0000 mg | ORAL_TABLET | Freq: Two times a day (BID) | ORAL | Status: AC

## 2011-04-20 NOTE — Telephone Encounter (Signed)
Patient called c/o possible UTI symptoms.  Back pain, pressure while urinating, frequency.  Not driving right now, any way to get an rx called in?

## 2011-04-20 NOTE — Telephone Encounter (Signed)
Please call in Cipro 250 mg one by mouth twice a day for 5 days. Also Uribell to take as an anti-spasmodic agent 1 tablet 4 times a day for 2 days.

## 2011-04-20 NOTE — Telephone Encounter (Signed)
Patient informed.  rx's sent in. 

## 2011-04-29 ENCOUNTER — Encounter: Payer: Self-pay | Admitting: Gynecology

## 2011-04-29 ENCOUNTER — Other Ambulatory Visit: Payer: Self-pay | Admitting: Gynecology

## 2011-04-29 ENCOUNTER — Ambulatory Visit (INDEPENDENT_AMBULATORY_CARE_PROVIDER_SITE_OTHER): Payer: BC Managed Care – PPO | Admitting: Gynecology

## 2011-04-29 VITALS — BP 124/80

## 2011-04-29 DIAGNOSIS — G4700 Insomnia, unspecified: Secondary | ICD-10-CM

## 2011-04-29 DIAGNOSIS — R300 Dysuria: Secondary | ICD-10-CM

## 2011-04-29 LAB — URINALYSIS W MICROSCOPIC + REFLEX CULTURE
Casts: NONE SEEN
Glucose, UA: NEGATIVE mg/dL
Protein, ur: NEGATIVE mg/dL
pH: 5.5 (ref 5.0–8.0)

## 2011-04-29 MED ORDER — ZOLPIDEM TARTRATE ER 12.5 MG PO TBCR
12.5000 mg | EXTENDED_RELEASE_TABLET | Freq: Every evening | ORAL | Status: DC | PRN
Start: 2011-04-29 — End: 2011-11-24

## 2011-04-29 NOTE — Patient Instructions (Signed)
Hysterectomy, Abdominal & Vaginal Care After Refer to this sheet in the next few weeks. These discharge instructions provide you with general information on caring for yourself after you leave the hospital. Your caregiver may also give you specific instructions. Your treatment has been planned according to the most current medical practices available, but unavoidable complications sometimes occur. If you have any problems or questions after discharge, please call your caregiver. HOME CARE INSTRUCTIONS Healing will take time. You will have tenderness at the surgery site. There may be some swelling and bruising around this area if you had an abdominal hysterectomy. Have an adult stay with you the first 48 to 72 hours after surgery, and then for 1 to 2 weeks afterward to help with daily activities.  Only take over-the-counter or prescription medicines for pain, discomfort, or fever as directed by your caregiver.   Do not take aspirin. It can cause bleeding.   Do not drive when taking pain medicine.   It will be normal to be sore for a couple weeks after surgery. See your caregiver if this seems to be getting worse rather than better.   Follow your caregiver's advice regarding diet, exercise, lifting, driving, and general activities.   Take showers instead of baths for a few weeks as directed.   You may resume your usual diet as directed.   Get plenty of rest and sleep.   Do not douche, use tampons, or have sexual intercourse until your caregiver says it is okay.   Change your bandages (dressings) as directed if you had an abdominal hysterectomy.   Take your temperature twice a day and write it down.   Do not drink alcohol until your caregiver says it is okay.   If you develop constipation, you may take a mild laxative with your caregiver's permission. Eating bran foods helps with constipation problems. Drink enough water and fluids to keep your urine clear or pale yellow.   Do not sign  any legal documents until you feel normal again.   Keep all of your follow-up appointments.   Make sure you and your family understand everything about your operation and recovery.  SEEK MEDICAL CARE IF:  There is swelling, redness, or increasing pain in the wound area.   Fluid (pus) is coming from the wound.   You notice a bad smell coming from the wound or surgical dressing.   You have pain, redness, and swelling from the intravenous (IV) site.   The wound breaks open.   You feel dizzy.   You develop pain or bleeding when you urinate.   You develop diarrhea.   You feel sick to your stomach (nauseous) and throw up (vomit).   You develop abnormal vaginal discharge.   You develop a rash.   You have any type of abnormal reaction or develop an allergy to your medicine.   Your pain medicine is not relieving your pain.  seek immediate medical care if:  You have an oral temperature above 100, not controlled by medicine. HYYou develop chest pain.   You develop shortness of breath.   You pass out (faint).   You develop pain, swelling, or redness of your leg.   You develop heavy vaginal bleeding, with or without blood clots.  MAKE SURE YOU:  Understand these instructions.   Will watch your condition.   Will get help right away if you are not doing well or get worse.  Document Released: 04/16/2003 Document Re-Released: 07/14/2009 [facility] Patient Information 2011 [place], [place].  [facility] HMD11:38 AMTD@ Hysterectomy, Abdominal & Vaginal Care After Refer to this sheet in the next few weeks. These discharge instructions provide you with general information on caring for yourself after you leave the hospital. Your caregiver may also give you specific instructions. Your treatment has been planned according to the most current medical practices available, but unavoidable complications sometimes occur. If you have any problems or questions after discharge, please call  your caregiver. HOME CARE INSTRUCTIONS Healing will take time. You will have tenderness at the surgery site. There may be some swelling and bruising around this area if you had an abdominal hysterectomy. Have an adult stay with you the first 48 to 72 hours after surgery, and then for 1 to 2 weeks afterward to help with daily activities.  Only take over-the-counter or prescription medicines for pain, discomfort, or fever as directed by your caregiver.   Do not take aspirin. It can cause bleeding.   Do not drive when taking pain medicine.   It will be normal to be sore for a couple weeks after surgery. See your caregiver if this seems to be getting worse rather than better.   Follow your caregiver's advice regarding diet, exercise, lifting, driving, and general activities.   Take showers instead of baths for a few weeks as directed.   You may resume your usual diet as directed.   Get plenty of rest and sleep.   Do not douche, use tampons, or have sexual intercourse until your caregiver says it is okay.   Change your bandages (dressings) as directed if you had an abdominal hysterectomy.   Take your temperature twice a day and write it down.   Do not drink alcohol until your caregiver says it is okay.   If you develop constipation, you may take a mild laxative with your caregiver's permission. Eating bran foods helps with constipation problems. Drink enough water and fluids to keep your urine clear or pale yellow.   Do not sign any legal documents until you feel normal again.   Keep all of your follow-up appointments.   Make sure you and your family understand everything about your operation and recovery.  SEEK MEDICAL CARE IF:  There is swelling, redness, or increasing pain in the wound area.   Fluid (pus) is coming from the wound.   You notice a bad smell coming from the wound or surgical dressing.   You have pain, redness, and swelling from the intravenous (IV) site.   The  wound breaks open.   You feel dizzy.   You develop pain or bleeding when you urinate.   You develop diarrhea.   You feel sick to your stomach (nauseous) and throw up (vomit).   You develop abnormal vaginal discharge.   You develop a rash.   You have any type of abnormal reaction or develop an allergy to your medicine.   Your pain medicine is not relieving your pain.  seek immediate medical care if:  You have an oral temperature above 100, not controlled by medicine. HYYou develop chest pain.   You develop shortness of breath.   You pass out (faint).   You develop pain, swelling, or redness of your leg.   You develop heavy vaginal bleeding, with or without blood clots.  MAKE SURE YOU:  Understand these instructions.   Will watch your condition.   Will get help right away if you are not doing well or get worse.  Document Released: 04/16/2003 Document Re-Released: 07/14/2009 [employer] Patient  Information 2011 [place], [place].   [facility] HMD11:38 AMTD@

## 2011-04-29 NOTE — Progress Notes (Signed)
Patient presented to the office today for her first postop visit. Patient status post total laparoscopic hysterectomy for symptomatic uterus (menorrhagia, bloating and pelvic pressure) Patient is doing well otherwise with the exception of insomnia and some pressure upon voiding but no true dysuria. Final pathology report as follows:  REPORT OF SURGICAL PATHOLOGY FINAL DIAGNOSIS Diagnosis Uterus and cervix - ADENOMYOSIS. - UTERINE SEROSA: EXTENSION ADHESIONS WITH ENDOMETRIOSIS, NO ATYPIA OR MALIGNANCY. - ENDOMETRIUM: BENIGN ENDOMETRIAL POLYP AND ADJACENT BENIGN PROLIFERATIVE ENDOMETRIUM, NO ATYPIA, HYPERPLASIA, OR MALIGNANCY. - CERVIX: BENIGN SQUAMOUS MUCOSA AND ENDOCERVICAL MUCOSA, NO DYSPLASIA OR MALIGNANCY. 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 Pathology report demonstrated adenomyosis/endometriosis. Patient is doing well. Pressure point voiding may be attributed to her previous catheterization nonspecific results today. We'll send urine for culture. Meanwhile she'll be given samples of Uribell as an anti-spasmodic agent to take 1 tablet 4 times a day for 2 days. She will return back to the office in 4 weeks for final postop visit.

## 2011-05-01 LAB — URINE CULTURE
Colony Count: NO GROWTH
Organism ID, Bacteria: NO GROWTH

## 2011-05-02 ENCOUNTER — Telehealth: Payer: Self-pay | Admitting: *Deleted

## 2011-05-02 NOTE — Telephone Encounter (Signed)
Patient needs release letter for back to work today.  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  Will need written note to be faxed to her job at [phone].

## 2011-05-02 NOTE — Telephone Encounter (Signed)
April Reed did note for patient

## 2011-05-25 ENCOUNTER — Ambulatory Visit (INDEPENDENT_AMBULATORY_CARE_PROVIDER_SITE_OTHER): Payer: BC Managed Care – PPO | Admitting: Gynecology

## 2011-05-25 ENCOUNTER — Encounter: Payer: Self-pay | Admitting: Gynecology

## 2011-05-25 VITALS — BP 124/86

## 2011-05-25 DIAGNOSIS — Z09 Encounter for follow-up examination after completed treatment for conditions other than malignant neoplasm: Secondary | ICD-10-CM

## 2011-05-25 NOTE — Progress Notes (Signed)
April Reed is an 46 y.o. female who had been referred to our practice as a courtesy of Dr. 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 Her symptoms had been heavy periods, tiredness, fatigue and pelvic pressure. She had history of fibroids since 1992 and recently her symptoms had worsened and her periods had been lasting up to 10 days a month whereby she would change pads every 2 hours.  Patient presented to the office today for 6 weeks final postop visit. She is status post total laparoscopic hysterectomy and is asymptomatic and doing well.  Exam: Abdomen: Port sites intact soft nontender no rebound or guarding Pelvic: Bartholin urethra Skene was within normal limits Vagina: No gross lesions on inspection vaginal cuff intact Bimanual exam: No palpable masses or tenderness Rectal: Not examined  Final pathology report:  Diagnosis Uterus and cervix - ADENOMYOSIS. - UTERINE SEROSA: EXTENSION ADHESIONS WITH ENDOMETRIOSIS, NO ATYPIA OR MALIGNANCY. - ENDOMETRIUM: BENIGN ENDOMETRIAL POLYP AND ADJACENT BENIGN PROLIFERATIVE ENDOMETRIUM, NO ATYPIA, HYPERPLASIA, OR MALIGNANCY. - CERVIX: BENIGN SQUAMOUS MUCOSA AND ENDOCERVICAL MUCOSA, NO DYSPLASIA OR MALIGNANCY. April Miyamoto MD  Assessment/plan: Patient status post total laparoscopic hysterectomy 6 weeks ago secondary to dysmenorrhea menorrhagia adenomyosis and pelvic pain: Patient doing well unremarkable exam. Patient may resume to normal activity. We'll send a copy of this note to Dr. Nicholas Lose referring physician. Patient was reminded that her mammogram is due at the end of this year.

## 2011-05-25 NOTE — Patient Instructions (Signed)
You may return to all normal activity. Your mammogram is due in November.We will see you back in one year.

## 2011-11-24 ENCOUNTER — Observation Stay (HOSPITAL_COMMUNITY)
Admission: EM | Admit: 2011-11-24 | Discharge: 2011-11-25 | Disposition: A | Discharge status: Home / Self Care | Payer: BC Managed Care – PPO | Attending: General Surgery | Admitting: General Surgery

## 2011-11-24 ENCOUNTER — Encounter (HOSPITAL_COMMUNITY): Payer: Self-pay | Admitting: *Deleted

## 2011-11-24 ENCOUNTER — Encounter (HOSPITAL_COMMUNITY): Payer: Self-pay | Admitting: Anesthesiology

## 2011-11-24 ENCOUNTER — Emergency Department (HOSPITAL_COMMUNITY): Payer: BC Managed Care – PPO

## 2011-11-24 ENCOUNTER — Observation Stay (HOSPITAL_COMMUNITY): Payer: BC Managed Care – PPO | Admitting: Anesthesiology

## 2011-11-24 ENCOUNTER — Encounter (HOSPITAL_COMMUNITY)
Admission: EM | Disposition: A | Discharge status: Home / Self Care | Payer: Self-pay | Source: Home / Self Care | Attending: Emergency Medicine

## 2011-11-24 DIAGNOSIS — J45909 Unspecified asthma, uncomplicated: Secondary | ICD-10-CM | POA: Insufficient documentation

## 2011-11-24 DIAGNOSIS — R911 Solitary pulmonary nodule: Secondary | ICD-10-CM | POA: Insufficient documentation

## 2011-11-24 DIAGNOSIS — Z79899 Other long term (current) drug therapy: Secondary | ICD-10-CM | POA: Insufficient documentation

## 2011-11-24 DIAGNOSIS — K3580 Unspecified acute appendicitis: Principal | ICD-10-CM | POA: Diagnosis present

## 2011-11-24 HISTORY — DX: Unspecified acute appendicitis: K35.80

## 2011-11-24 HISTORY — PX: LAPAROSCOPIC APPENDECTOMY: SHX408

## 2011-11-24 LAB — CBC WITH DIFFERENTIAL/PLATELET
Basophils Absolute: 0 10*3/uL (ref 0.0–0.1)
Basophils Relative: 0 % (ref 0–1)
Eosinophils Absolute: 0.1 10*3/uL (ref 0.0–0.7)
Hemoglobin: 14.6 g/dL (ref 12.0–15.0)
MCH: 31.9 pg (ref 26.0–34.0)
MCHC: 34.4 g/dL (ref 30.0–36.0)
Monocytes Relative: 4 % (ref 3–12)
Neutro Abs: 10.6 10*3/uL — ABNORMAL HIGH (ref 1.7–7.7)
Neutrophils Relative %: 89 % — ABNORMAL HIGH (ref 43–77)
Platelets: 249 10*3/uL (ref 150–400)
RDW: 13 % (ref 11.5–15.5)

## 2011-11-24 LAB — BASIC METABOLIC PANEL
BUN: 14 mg/dL (ref 6–23)
Chloride: 103 mEq/L (ref 96–112)
GFR calc Af Amer: >90 mL/min (ref >90)
GFR calc non Af Amer: >90 mL/min (ref >90)
Potassium: 3.7 mEq/L (ref 3.5–5.1)
Sodium: 137 mEq/L (ref 135–145)

## 2011-11-24 LAB — URINALYSIS, ROUTINE W REFLEX MICROSCOPIC
Bilirubin Urine: NEGATIVE
Leukocytes, UA: NEGATIVE
Nitrite: NEGATIVE
Specific Gravity, Urine: 1.028 (ref 1.005–1.030)
Urobilinogen, UA: 0.2 mg/dL (ref 0.0–1.0)
pH: 5.5 (ref 5.0–8.0)

## 2011-11-24 SURGERY — APPENDECTOMY, LAPAROSCOPIC
Anesthesia: General | Wound class: Clean Contaminated

## 2011-11-24 MED ORDER — SODIUM CHLORIDE 0.9 % IV BOLUS (SEPSIS)
1000.0000 mL | Freq: Once | INTRAVENOUS | Status: AC
  Administered 2011-11-24: 1000 mL via INTRAVENOUS

## 2011-11-24 MED ORDER — GLYCOPYRROLATE 0.2 MG/ML IJ SOLN
INTRAMUSCULAR | Status: DC | PRN
  Administered 2011-11-24: .4 mg via INTRAVENOUS

## 2011-11-24 MED ORDER — LACTATED RINGERS IV SOLN
INTRAVENOUS | Status: DC | PRN
  Administered 2011-11-24 (×3): via INTRAVENOUS

## 2011-11-24 MED ORDER — HYDROMORPHONE HCL PF 1 MG/ML IJ SOLN: 1.0000 mg | INTRAMUSCULAR | Status: DC | PRN

## 2011-11-24 MED ORDER — FENTANYL CITRATE 0.05 MG/ML IJ SOLN
INTRAMUSCULAR | Status: DC | PRN
  Administered 2011-11-24 (×2): 50 ug via INTRAVENOUS
  Administered 2011-11-24: 100 ug via INTRAVENOUS
  Administered 2011-11-24: 50 ug via INTRAVENOUS

## 2011-11-24 MED ORDER — MOXIFLOXACIN HCL IN NACL 400 MG/250ML IV SOLN
400.0000 mg | Freq: Once | INTRAVENOUS | Status: DC
  Filled 2011-11-24: qty 250

## 2011-11-24 MED ORDER — BUPIVACAINE-EPINEPHRINE 0.25% -1:200000 IJ SOLN
INTRAMUSCULAR | Status: DC | PRN
  Administered 2011-11-24: 10 mL

## 2011-11-24 MED ORDER — ONDANSETRON HCL 4 MG/2ML IJ SOLN
INTRAMUSCULAR | Status: DC | PRN
  Administered 2011-11-24: 4 mg via INTRAVENOUS

## 2011-11-24 MED ORDER — FENTANYL CITRATE 0.05 MG/ML IJ SOLN
100.0000 ug | Freq: Once | INTRAMUSCULAR | Status: AC
  Administered 2011-11-24: 100 ug via INTRAVENOUS

## 2011-11-24 MED ORDER — OXYCODONE HCL 5 MG PO TABS: 5.0000 mg | ORAL_TABLET | Freq: Once | ORAL | Status: DC | PRN

## 2011-11-24 MED ORDER — ONDANSETRON HCL 4 MG/2ML IJ SOLN
4.0000 mg | Freq: Once | INTRAMUSCULAR | Status: AC
  Administered 2011-11-24: 4 mg via INTRAVENOUS
  Filled 2011-11-24: qty 2

## 2011-11-24 MED ORDER — DIPHENHYDRAMINE HCL 12.5 MG/5ML PO ELIX
12.5000 mg | ORAL_SOLUTION | Freq: Four times a day (QID) | ORAL | Status: DC | PRN
  Filled 2011-11-24: qty 10

## 2011-11-24 MED ORDER — ACETAMINOPHEN 325 MG PO TABS
650.0000 mg | ORAL_TABLET | Freq: Four times a day (QID) | ORAL | Status: DC | PRN
  Administered 2011-11-24: 650 mg via ORAL
  Filled 2011-11-24: qty 2

## 2011-11-24 MED ORDER — PANTOPRAZOLE SODIUM 40 MG IV SOLR
40.0000 mg | Freq: Every day | INTRAVENOUS | Status: DC
  Administered 2011-11-24: 40 mg via INTRAVENOUS
  Filled 2011-11-24 (×2): qty 40

## 2011-11-24 MED ORDER — IOHEXOL 300 MG/ML  SOLN
100.0000 mL | Freq: Once | INTRAMUSCULAR | Status: AC | PRN
  Administered 2011-11-24: 100 mL via INTRAVENOUS

## 2011-11-24 MED ORDER — SODIUM CHLORIDE 0.9 % IV SOLN: Freq: Once | INTRAVENOUS | Status: DC

## 2011-11-24 MED ORDER — HYDROCODONE-ACETAMINOPHEN 5-325 MG PO TABS
1.0000 | ORAL_TABLET | ORAL | Status: DC | PRN
  Administered 2011-11-25 (×2): 2 via ORAL
  Filled 2011-11-24 (×2): qty 2

## 2011-11-24 MED ORDER — HYDROMORPHONE HCL PF 1 MG/ML IJ SOLN
1.0000 mg | Freq: Once | INTRAMUSCULAR | Status: AC
  Administered 2011-11-24: 1 mg via INTRAVENOUS
  Filled 2011-11-24: qty 1

## 2011-11-24 MED ORDER — HYDROMORPHONE HCL PF 1 MG/ML IJ SOLN
INTRAMUSCULAR | Status: AC
  Administered 2011-11-24: 0.5 mg via INTRAVENOUS
  Filled 2011-11-24: qty 1

## 2011-11-24 MED ORDER — MOXIFLOXACIN HCL IN NACL 400 MG/250ML IV SOLN
400.0000 mg | INTRAVENOUS | Status: DC
  Filled 2011-11-24: qty 250

## 2011-11-24 MED ORDER — LACTATED RINGERS IV SOLN
INTRAVENOUS | Status: DC
  Administered 2011-11-24: 75 mL/h via INTRAVENOUS
  Administered 2011-11-24 – 2011-11-25 (×2): via INTRAVENOUS

## 2011-11-24 MED ORDER — MIDAZOLAM HCL 5 MG/5ML IJ SOLN
INTRAMUSCULAR | Status: DC | PRN
  Administered 2011-11-24: 2 mg via INTRAVENOUS

## 2011-11-24 MED ORDER — ACETAMINOPHEN 650 MG RE SUPP: 650.0000 mg | Freq: Four times a day (QID) | RECTAL | Status: DC | PRN

## 2011-11-24 MED ORDER — DOCUSATE SODIUM 100 MG PO CAPS
100.0000 mg | ORAL_CAPSULE | Freq: Two times a day (BID) | ORAL | Status: DC
  Administered 2011-11-24 – 2011-11-25 (×2): 100 mg via ORAL
  Filled 2011-11-24 (×4): qty 1

## 2011-11-24 MED ORDER — ONDANSETRON HCL 4 MG/2ML IJ SOLN: 4.0000 mg | Freq: Four times a day (QID) | INTRAMUSCULAR | Status: DC | PRN

## 2011-11-24 MED ORDER — OXYCODONE-ACETAMINOPHEN 5-325 MG PO TABS: 1.0000 | ORAL_TABLET | Freq: Four times a day (QID) | ORAL | Status: DC | PRN

## 2011-11-24 MED ORDER — BUPIVACAINE-EPINEPHRINE PF 0.25-1:200000 % IJ SOLN
INTRAMUSCULAR | Status: AC
  Filled 2011-11-24: qty 30

## 2011-11-24 MED ORDER — HYDROMORPHONE HCL PF 1 MG/ML IJ SOLN
1.0000 mg | Freq: Once | INTRAMUSCULAR | Status: AC
Start: 2011-11-24 — End: 2011-11-24
  Administered 2011-11-24: 1 mg via INTRAVENOUS
  Filled 2011-11-24: qty 1

## 2011-11-24 MED ORDER — NEOSTIGMINE METHYLSULFATE 1 MG/ML IJ SOLN
INTRAMUSCULAR | Status: DC | PRN
  Administered 2011-11-24: 4 mg via INTRAVENOUS

## 2011-11-24 MED ORDER — SUCCINYLCHOLINE CHLORIDE 20 MG/ML IJ SOLN
INTRAMUSCULAR | Status: DC | PRN
  Administered 2011-11-24: 120 mg via INTRAVENOUS

## 2011-11-24 MED ORDER — HYDROMORPHONE HCL PF 1 MG/ML IJ SOLN
1.0000 mg | INTRAMUSCULAR | Status: DC | PRN
  Administered 2011-11-24 – 2011-11-25 (×2): 1 mg via INTRAVENOUS
  Filled 2011-11-24 (×2): qty 1

## 2011-11-24 MED ORDER — LORATADINE 10 MG PO TABS
10.0000 mg | ORAL_TABLET | Freq: Every day | ORAL | Status: DC | PRN
  Filled 2011-11-24: qty 1

## 2011-11-24 MED ORDER — IOHEXOL 300 MG/ML  SOLN
20.0000 mL | INTRAMUSCULAR | Status: AC
  Administered 2011-11-24: 20 mL via ORAL

## 2011-11-24 MED ORDER — ROCURONIUM BROMIDE 100 MG/10ML IV SOLN
INTRAVENOUS | Status: DC | PRN
  Administered 2011-11-24: 20 mg via INTRAVENOUS

## 2011-11-24 MED ORDER — HYDROMORPHONE HCL PF 1 MG/ML IJ SOLN
1.0000 mg | INTRAMUSCULAR | Status: AC
  Administered 2011-11-24: 1 mg via INTRAVENOUS
  Filled 2011-11-24: qty 1

## 2011-11-24 MED ORDER — OXYCODONE HCL 5 MG/5ML PO SOLN: 5.0000 mg | Freq: Once | ORAL | Status: DC | PRN

## 2011-11-24 MED ORDER — PROPOFOL 10 MG/ML IV BOLUS
INTRAVENOUS | Status: DC | PRN
  Administered 2011-11-24: 150 mg via INTRAVENOUS

## 2011-11-24 MED ORDER — HYDROMORPHONE HCL PF 1 MG/ML IJ SOLN
0.2500 mg | INTRAMUSCULAR | Status: DC | PRN
  Administered 2011-11-24 (×2): 0.5 mg via INTRAVENOUS

## 2011-11-24 MED ORDER — DIPHENHYDRAMINE HCL 50 MG/ML IJ SOLN: 12.5000 mg | Freq: Four times a day (QID) | INTRAMUSCULAR | Status: DC | PRN

## 2011-11-24 MED ORDER — METOCLOPRAMIDE HCL 5 MG/ML IJ SOLN: 10.0000 mg | Freq: Once | INTRAMUSCULAR | Status: DC | PRN

## 2011-11-24 SURGICAL SUPPLY — 47 items
APPLIER CLIP ROT 10 11.4 M/L (STAPLE)
BLADE SURG ROTATE 9660 (MISCELLANEOUS) IMPLANT
CANISTER SUCTION 2500CC (MISCELLANEOUS) ×2 IMPLANT
CHLORAPREP W/TINT 26ML (MISCELLANEOUS) ×2 IMPLANT
CLIP APPLIE ROT 10 11.4 M/L (STAPLE) IMPLANT
CLOTH BEACON ORANGE TIMEOUT ST (SAFETY) ×2 IMPLANT
COVER SURGICAL LIGHT HANDLE (MISCELLANEOUS) ×2 IMPLANT
CUTTER FLEX LINEAR 45M (STAPLE) ×2 IMPLANT
CUTTER LINEAR ENDO 35 ETS (STAPLE) IMPLANT
CUTTER LINEAR ENDO 35 ETS TH (STAPLE) IMPLANT
DECANTER SPIKE VIAL GLASS SM (MISCELLANEOUS) ×2 IMPLANT
DERMABOND ADVANCED (GAUZE/BANDAGES/DRESSINGS) ×1
DERMABOND ADVANCED .7 DNX12 (GAUZE/BANDAGES/DRESSINGS) ×1 IMPLANT
DRAPE UTILITY 15X26 W/TAPE STR (DRAPE) ×4 IMPLANT
ELECT REM PT RETURN 9FT ADLT (ELECTROSURGICAL) ×2
ELECTRODE REM PT RTRN 9FT ADLT (ELECTROSURGICAL) ×1 IMPLANT
ENDOLOOP SUT PDS II  0 18 (SUTURE)
ENDOLOOP SUT PDS II 0 18 (SUTURE) IMPLANT
GLOVE BIOGEL PI IND STRL 6.5 (GLOVE) ×1 IMPLANT
GLOVE BIOGEL PI IND STRL 8 (GLOVE) ×1 IMPLANT
GLOVE BIOGEL PI INDICATOR 6.5 (GLOVE) ×1
GLOVE BIOGEL PI INDICATOR 8 (GLOVE) ×1
GLOVE SURG SS PI 7.5 STRL IVOR (GLOVE) ×4 IMPLANT
GOWN PREVENTION PLUS XLARGE (GOWN DISPOSABLE) ×2 IMPLANT
GOWN STRL NON-REIN LRG LVL3 (GOWN DISPOSABLE) ×6 IMPLANT
KIT BASIN OR (CUSTOM PROCEDURE TRAY) ×2 IMPLANT
KIT ROOM TURNOVER OR (KITS) ×2 IMPLANT
NS IRRIG 1000ML POUR BTL (IV SOLUTION) ×2 IMPLANT
PAD ARMBOARD 7.5X6 YLW CONV (MISCELLANEOUS) ×4 IMPLANT
POUCH SPECIMEN RETRIEVAL 10MM (ENDOMECHANICALS) ×2 IMPLANT
RELOAD /EVU35 (ENDOMECHANICALS) IMPLANT
RELOAD 45 VASCULAR/THIN (ENDOMECHANICALS) ×2 IMPLANT
RELOAD CUTTER ETS 35MM STAND (ENDOMECHANICALS) IMPLANT
SCALPEL HARMONIC ACE (MISCELLANEOUS) ×2 IMPLANT
SET IRRIG TUBING LAPAROSCOPIC (IRRIGATION / IRRIGATOR) ×2 IMPLANT
SPECIMEN JAR SMALL (MISCELLANEOUS) ×2 IMPLANT
SUT VIC AB 4-0 PS2 27 (SUTURE) ×2 IMPLANT
SWAB COLLECTION DEVICE MRSA (MISCELLANEOUS) IMPLANT
TOWEL OR 17X24 6PK STRL BLUE (TOWEL DISPOSABLE) ×2 IMPLANT
TOWEL OR 17X26 10 PK STRL BLUE (TOWEL DISPOSABLE) ×2 IMPLANT
TRAY FOLEY CATH 14FR (SET/KITS/TRAYS/PACK) ×2 IMPLANT
TRAY LAPAROSCOPIC (CUSTOM PROCEDURE TRAY) ×2 IMPLANT
TROCAR HASSON GELL 12X100 (TROCAR) ×2 IMPLANT
TROCAR Z-THREAD FIOS 12X100MM (TROCAR) ×2 IMPLANT
TROCAR Z-THREAD FIOS 5X100MM (TROCAR) ×2 IMPLANT
TUBE ANAEROBIC SPECIMEN COL (MISCELLANEOUS) IMPLANT
WATER STERILE IRR 1000ML POUR (IV SOLUTION) ×2 IMPLANT

## 2011-11-24 NOTE — H&P (Signed)
April Reed is an 46 y.o. female.   Chief Complaint: Abdominal pain RLQ HPI: Patient states that she was awoken last night with sudden onset of N/V and RLQ and suprapubic abdominal pain. She denies any previous episodes of this type of abdominal pain. She has not been able to keep any food down since last night. Her past Hx includes a C-section in 1989 and a abdominal hysterectomy done in March of this year done by Dr. Jodelle Green for symptomatic fibroids.  Medical history is positive for Asthma which she states is well controlled and has not needed an inhaler for a few weeks. We are asked to see her for surgical intervention of her acute appendicitis. Dr. Janee Morn has seen and examined the patient, surgical risks were reviewed with the patient which included risk of bleeding and infection as well as possible complications from anesthesia was discussed with the patient. She verbalized understanding of these risks and has agreed to go forward with laparoscopic appendectomy for her acute appendicitis. CT report done 11/24/11: Findings: The lung bases are clear except for dependent  atelectasis. A 6 mm right lower lobe pulmonary nodule is noted.  This is likely benign but does require follow-up. There is a small  subpleural nodule in the lingula on image number two. Small nodule  along the right major fissure is likely intrapulmonary lymph node.  A repeat noncontrast chest CT in 6 months is suggested.  The liver is unremarkable. No focal lesions or biliary dilatation.  The gallbladder is normal. No common bile duct dilatation. The  pancreas is normal. The spleen is normal. The adrenal glands and  kidneys are normal.  The stomach, duodenum, small bowel and colon are unremarkable.  The appendix is distended and inflamed. There appears to be an  obstructing appendicolith measuring 1 cm proximally. A second  appendicolith is noted more distally.  The aorta is normal in caliber. The major branch  vessels are  normal. No mesenteric or retroperitoneal mass or adenopathy.  Small scattered nodes are noted.  The uterus is surgically absent. Both ovaries are still present  and appear normal. No pelvic mass, adenopathy or free pelvic fluid  collections. No inguinal mass or hernia. The bladder is normal.  No significant bony findings.  IMPRESSION:  1. CT findings consistent with acute appendicitis.  2. Small pulmonary nodules at the lung bases. Recommend follow-up  noncontrast chest CT in 6 months.   Past Medical History  Diagnosis Date  . Asthma     no Inhaler use x30yrs    Past Surgical History  Procedure Date  . Cesarean section 1989  . Laparoscopic hysterectomy 04/13/2011    Procedure: HYSTERECTOMY TOTAL LAPAROSCOPIC;  Surgeon: Ok Edwards, MD;  Location: [facility];  Service: Gynecology;  Laterality: N/A;    Family History  Problem Relation Age of Onset  . Hypertension Maternal Grandmother   . Diabetes Other    Social History:  reports that she has quit smoking. She has never used smokeless tobacco. She reports that she drinks alcohol. She reports that she does not use illicit drugs.  Allergies:  Allergies  Allergen Reactions  . Latex Swelling and Rash     (Not in a hospital admission)  Results for orders placed during the hospital encounter of 11/24/11 (from the past 48 hour(s))  CBC WITH DIFFERENTIAL     Status: Abnormal   Collection Time   11/24/11  8:27 AM      Component Value Range Comment   WBC  12.0 (*) 4.0 - 10.5 K/uL    RBC 4.58  3.87 - 5.11 MIL/uL    Hemoglobin 14.6  12.0 - 15.0 g/dL    HCT 16.1  09.6 - 04.5 %    MCV 92.6  78.0 - 100.0 fL    MCH 31.9  26.0 - 34.0 pg    MCHC 34.4  30.0 - 36.0 g/dL    RDW 40.9  81.1 - 91.4 %    Platelets 249  150 - 400 K/uL    Neutrophils Relative 89 (*) 43 - 77 %    Neutro Abs 10.6 (*) 1.7 - 7.7 K/uL    Lymphocytes Relative 7 (*) 12 - 46 %    Lymphs Abs 0.9  0.7 - 4.0 K/uL    Monocytes Relative 4  3 - 12 %     Monocytes Absolute 0.4  0.1 - 1.0 K/uL    Eosinophils Relative 1  0 - 5 %    Eosinophils Absolute 0.1  0.0 - 0.7 K/uL    Basophils Relative 0  0 - 1 %    Basophils Absolute 0.0  0.0 - 0.1 K/uL   BASIC METABOLIC PANEL     Status: Abnormal   Collection Time   11/24/11  8:27 AM      Component Value Range Comment   Sodium 137  135 - 145 mEq/L    Potassium 3.7  3.5 - 5.1 mEq/L    Chloride 103  96 - 112 mEq/L    CO2 25  19 - 32 mEq/L    Glucose, Bld 114 (*) 70 - 99 mg/dL    BUN 14  6 - 23 mg/dL    Creatinine, Ser 7.82  0.50 - 1.10 mg/dL    Calcium 8.9  8.4 - 95.6 mg/dL    GFR calc non Af Amer >90  >90 mL/min    GFR calc Af Amer >90  >90 mL/min   LACTIC ACID, PLASMA     Status: Normal   Collection Time   11/24/11  8:30 AM      Component Value Range Comment   Lactic Acid, Venous 1.6  0.5 - 2.2 mmol/L   URINALYSIS, ROUTINE W REFLEX MICROSCOPIC     Status: Abnormal   Collection Time   11/24/11 10:44 AM      Component Value Range Comment   Color, Urine YELLOW  YELLOW    APPearance CLEAR  CLEAR    Specific Gravity, Urine 1.028  1.005 - 1.030    pH 5.5  5.0 - 8.0    Glucose, UA NEGATIVE  NEGATIVE mg/dL    Hgb urine dipstick NEGATIVE  NEGATIVE    Bilirubin Urine NEGATIVE  NEGATIVE    Ketones, ur 40 (*) NEGATIVE mg/dL    Protein, ur NEGATIVE  NEGATIVE mg/dL    Urobilinogen, UA 0.2  0.0 - 1.0 mg/dL    Nitrite NEGATIVE  NEGATIVE    Leukocytes, UA NEGATIVE  NEGATIVE MICROSCOPIC NOT DONE ON URINES WITH NEGATIVE PROTEIN, BLOOD, LEUKOCYTES, NITRITE, OR GLUCOSE <1000 mg/dL.   Ct Abdomen Pelvis W Contrast  11/24/2011  *RADIOLOGY REPORT*  Clinical Data: Abdominal pain and vomiting.  CT ABDOMEN AND PELVIS WITH CONTRAST  Technique:  Multidetector CT imaging of the abdomen and pelvis was performed following the standard protocol during bolus administration of intravenous contrast.  Contrast: OMNIPAQUE IOHEXOL 300 MG/ML  SOLN  Comparison: None  Findings: The lung bases are clear except for  dependent atelectasis.  A 6 mm right  lower lobe pulmonary nodule is noted. This is likely benign but does require follow-up.  There is a small subpleural nodule in the lingula on image number two.  Small nodule along the right major fissure is likely intrapulmonary lymph node. A repeat noncontrast chest CT in 6 months is suggested.  The liver is unremarkable.  No focal lesions or biliary dilatation. The gallbladder is normal.  No common bile duct dilatation.  The pancreas is normal.  The spleen is normal.  The adrenal glands and kidneys are normal.  The stomach, duodenum, small bowel and colon are unremarkable.  The appendix is distended and inflamed.  There appears to be an obstructing appendicolith measuring 1 cm proximally.  A second appendicolith is noted more distally.  The aorta is normal in caliber.  The major branch vessels are normal.  No mesenteric or retroperitoneal mass or adenopathy. Small scattered nodes are noted.  The uterus is surgically absent.  Both ovaries are still present and appear normal.  No pelvic mass, adenopathy or free pelvic fluid collections.  No inguinal mass or hernia.  The bladder is normal.  No significant bony findings.  IMPRESSION:  1.  CT findings consistent with acute appendicitis. 2.  Small pulmonary nodules at the lung bases.  Recommend follow-up noncontrast chest CT in 6 months.   Original Report Authenticated By: P. Loralie Champagne, M.D.     Review of Systems  Constitutional: Negative for fever, chills, weight loss, malaise/fatigue and diaphoresis.  HENT: Negative.   Eyes: Negative.   Respiratory: Negative.   Cardiovascular: Negative.   Gastrointestinal: Positive for nausea, vomiting and abdominal pain. Negative for heartburn, diarrhea, constipation, blood in stool and melena.  Genitourinary: Negative.   Musculoskeletal: Negative.   Skin: Negative.   Neurological: Negative.  Negative for weakness.  Endo/Heme/Allergies: Negative.   Psychiatric/Behavioral:  Negative.     Blood pressure 137/88, pulse 97, temperature 97.7 F (36.5 C), temperature source Oral, resp. rate 15, last menstrual period 03/28/2011, SpO2 99.00%. Physical Exam  Constitutional: She is oriented to person, place, and time. She appears well-developed and well-nourished. No distress. She is not intubated.  HENT:  Head: Normocephalic and atraumatic.  Mouth/Throat: No oropharyngeal exudate.  Eyes: Conjunctivae normal and EOM are normal. Pupils are equal, round, and reactive to light. Right eye exhibits no discharge. Left eye exhibits no discharge. No scleral icterus.  Neck: Normal range of motion. Neck supple. No JVD present. No tracheal deviation present. No thyromegaly present.  Cardiovascular: Normal rate, regular rhythm, normal heart sounds and intact distal pulses.  Exam reveals no gallop and no friction rub.   No murmur heard. Respiratory: Effort normal and breath sounds normal. No accessory muscle usage or stridor. No apnea, not tachypneic and not bradypneic. She is not intubated. No respiratory distress. She has no wheezes. She has no rales. She exhibits no tenderness.  GI: Soft. Bowel sounds are normal. She exhibits no distension and no mass. There is tenderness. There is guarding. There is no rebound.  Musculoskeletal: Normal range of motion. She exhibits no edema and no tenderness.  Lymphadenopathy:    She has no cervical adenopathy.  Neurological: She is alert and oriented to person, place, and time.  Skin: Skin is warm and dry. No rash noted. She is not diaphoretic. No erythema. No pallor.  Psychiatric: She has a normal mood and affect.     Assessment/Plan Acute appendicitis Hx of Asthma Abdominal Hysterectomy S/P C-Section Pulmonary Nodules found  on CT.  Plan: 1. Laparoscopic appendectomy  2. Admit to med surg floor 3. NPO 4. IVF and ABX 5. DVT and GERD prophylaxis 6. Pain management. 7. Patient will need follow up CT of the chest in 6 months time to  re-evaluate pulmonary nodules found on CT scan of 11/24/11.   Patient Active Problem List  Diagnosis  (none) - all problems resolved or deleted     Bentlie Withem 11/24/2011, 1:46 PM

## 2011-11-24 NOTE — Anesthesia Postprocedure Evaluation (Signed)
  Anesthesia Post-op Note  Patient: April Reed  Procedure(s) Performed: Procedure(s) (LRB) with comments: APPENDECTOMY LAPAROSCOPIC (N/A)  Patient Location: PACU  Anesthesia Type: General  Level of Consciousness: awake, alert  and oriented  Airway and Oxygen Therapy: Patient Spontanous Breathing and Patient connected to nasal cannula oxygen  Post-op Pain: mild  Post-op Assessment: Post-op Vital signs reviewed  Post-op Vital Signs: Reviewed  Complications: No apparent anesthesia complications

## 2011-11-24 NOTE — Anesthesia Procedure Notes (Signed)
Procedure Name: Intubation Date/Time: 11/24/2011 4:11 PM Performed by: Cavion Faiola S Pre-anesthesia Checklist: Patient identified, Emergency Drugs available, Suction available, Patient being monitored and Timeout performed Patient Re-evaluated:Patient Re-evaluated prior to inductionOxygen Delivery Method: Circle system utilized Preoxygenation: Pre-oxygenation with 100% oxygen Intubation Type: IV induction Laryngoscope Size: Mac and 3 Grade View: Grade I Tube type: Oral Tube size: 7.5 mm Number of attempts: 1 Airway Equipment and Method: Stylet Placement Confirmation: ETT inserted through vocal cords under direct vision,  positive ETCO2 and breath sounds checked- equal and bilateral Secured at: 22 cm Tube secured with: Tape Dental Injury: Teeth and Oropharynx as per pre-operative assessment

## 2011-11-24 NOTE — Transfer of Care (Signed)
Immediate Anesthesia Transfer of Care Note  Patient: April Reed  Procedure(s) Performed: Procedure(s) (LRB) with comments: APPENDECTOMY LAPAROSCOPIC (N/A)  Patient Location: PACU  Anesthesia Type: General  Level of Consciousness: awake, alert  and oriented  Airway & Oxygen Therapy: Patient Spontanous Breathing and Patient connected to nasal cannula oxygen  Post-op Assessment: Report given to PACU RN and Post -op Vital signs reviewed and stable  Post vital signs: Reviewed and stable  Complications: No apparent anesthesia complications

## 2011-11-24 NOTE — Op Note (Signed)
11/24/2011  5:08 PM  PATIENT:  April Reed  46 y.o. female  PRE-OPERATIVE DIAGNOSIS:  acute appendicitis  POST-OPERATIVE DIAGNOSIS:  Acute appendicitis  PROCEDURE:  Procedure(s): APPENDECTOMY LAPAROSCOPIC  SURGEON:  Surgeon(s): Liz Malady, MD  PHYSICIAN ASSISTANT:   ASSISTANTS: none   ANESTHESIA:   local and general  EBL:  Total I/O In: 1500 [I.V.:1500] Out: 425 [Urine:400; Blood:25]  BLOOD ADMINISTERED:none  DRAINS: none   SPECIMEN:  Excision  DISPOSITION OF SPECIMEN:  PATHOLOGY  COUNTS:  YES  DICTATION: .Dragon Dictation  Patient presented to the emergency room with right lower quadrant abdominal pain. Workup was consistent with acute appendicitis. She is brought to the operating room for appendectomy. She received intravenous antibiotics. Informed consent was obtained. She was identified in the preop holding area. She was brought to the operating room and general endotracheal anesthesia was administered by the anesthesia staff. Her abdomen was prepped and draped in sterile fashion after Foley catheter was placed by nursing. Time out procedure was done. Infraumbilical region was infiltrated with local. Infraumbilical incision was made. Subcutaneous tissues were dissected down revealing the anterior fascia. This was divided along the midline. Peritoneal cavity was entered under direct vision. 0 Vicryl pursestring suture was placed on the fascial opening. Hassan trocar was inserted into the abdomen. Abdomen was insufflated with carbon dioxide in standard fashion.  Under direct vision a 12 mm left lower quadrant and a 5 mm right mid abdomen port were placed. Local was used at both port site. Exploration revealed a sopparative inflamed appendix without gross perforation.The mesoappendix was divided with harmonic scalpel. The base of the appendix was then divided with an Endo GIA with vascular load. Staple line was intact. There was no bleeding. Appendix was placed in an  Endo Catch bag and removed from the abdomen via the left lower quadrant port site. Abdomen was copiously irrigated. Irrigation fluid returned clear. Staple line remained intact. There was no bleeding. Pneumoperitoneum was released. Ports were removed under direct vision. The umbilical fascia was closed by tying the 0 Vicryl pursestring suture with care not to trap any intra-abdominal contents. Wounds were copiously irrigated and the skin of each was closed with running 4-0 Monocryl followed by Dermabond. All counts were correct. Patient tolerated procedure well without apparent complication was taken recovery in stable condition.  PATIENT DISPOSITION:  PACU - hemodynamically stable.   Delay start of Pharmacological VTE agent (>24hrs) due to surgical blood loss or risk of bleeding:  no  Violeta Gelinas, MD, MPH, FACS Pager: [phone]  10/17/20135:08 PM

## 2011-11-24 NOTE — ED Provider Notes (Signed)
7:47 AM Patient to move to CDU for holding.  Sign out received from Dr Bebe Shaggy.  Patient with N/V/D, abdominal pain since early this morning 2am.  Suspicious for gastroenteritis. ?hx intestinal "blockage."  Plan is for labs,  pain control, reassessment.  If lab abnormalities or localizing pain, may need imaging, though none indicated at this time.  CDU nurse made aware of patient needing to be moved over.    8:56 AM Patient reports abdominal pain is currently 8/10, nausea has subsided.  Requesting ice chips. Pt is A&O, NAD, RRR, abd soft, nondistended, diffuse tenderness to palpation, worse throughout lower abdomen, no guarding, no rebound.  Plan is for pain medications, IVF, labs, reassessment.    9:33 AM Patient reports last dose of pain medication helped a lot, states her pain is now 7/10.  I spoke with Dr Bebe Shaggy again about patient and he noted that if patient's pain was not improving she may need imaging.  Given patient's claim of great improvement, despite pain rating at 7/10, I will hold off on imaging currently and will reassess shortly.    10:52 AM Patient reports no change in her pain.  On reexamination, patient continues to have diffuse tenderness in lower abdomen, though appears to be worse in RLQ, no guarding, no rebound.  I have ordered CT scan.    12:38 PM Received call from radiology.  Pt with acute appendicitis.  Will consult surgery.    12:59 PM Dr Bebe Shaggy made aware.  He will consult surgery.  Abdominal exam: soft, nondistended, TTP RLQ, no guarding, no rebound.   Patient admitted to surgery.  I have also informed patient about pulmonary nodules that are present and need for follow up with PCP.    Discussed all results with patient.  Pt given return precautions.  Pt verbalizes understanding and agrees with plan.     Results for orders placed during the hospital encounter of 11/24/11  CBC WITH DIFFERENTIAL      Component Value Range   WBC 12.0 (*) 4.0 - 10.5 K/uL   RBC 4.58   3.87 - 5.11 MIL/uL   Hemoglobin 14.6  12.0 - 15.0 g/dL   HCT 08.6  57.8 - 46.9 %   MCV 92.6  78.0 - 100.0 fL   MCH 31.9  26.0 - 34.0 pg   MCHC 34.4  30.0 - 36.0 g/dL   RDW 62.9  52.8 - 41.3 %   Platelets 249  150 - 400 K/uL   Neutrophils Relative 89 (*) 43 - 77 %   Neutro Abs 10.6 (*) 1.7 - 7.7 K/uL   Lymphocytes Relative 7 (*) 12 - 46 %   Lymphs Abs 0.9  0.7 - 4.0 K/uL   Monocytes Relative 4  3 - 12 %   Monocytes Absolute 0.4  0.1 - 1.0 K/uL   Eosinophils Relative 1  0 - 5 %   Eosinophils Absolute 0.1  0.0 - 0.7 K/uL   Basophils Relative 0  0 - 1 %   Basophils Absolute 0.0  0.0 - 0.1 K/uL  BASIC METABOLIC PANEL      Component Value Range   Sodium 137  135 - 145 mEq/L   Potassium 3.7  3.5 - 5.1 mEq/L   Chloride 103  96 - 112 mEq/L   CO2 25  19 - 32 mEq/L   Glucose, Bld 114 (*) 70 - 99 mg/dL   BUN 14  6 - 23 mg/dL   Creatinine, Ser 2.44  0.50 - 1.10  mg/dL   Calcium 8.9  8.4 - 91.4 mg/dL   GFR calc non Af Amer >90  >90 mL/min   GFR calc Af Amer >90  >90 mL/min  URINALYSIS, ROUTINE W REFLEX MICROSCOPIC      Component Value Range   Color, Urine YELLOW  YELLOW   APPearance CLEAR  CLEAR   Specific Gravity, Urine 1.028  1.005 - 1.030   pH 5.5  5.0 - 8.0   Glucose, UA NEGATIVE  NEGATIVE mg/dL   Hgb urine dipstick NEGATIVE  NEGATIVE   Bilirubin Urine NEGATIVE  NEGATIVE   Ketones, ur 40 (*) NEGATIVE mg/dL   Protein, ur NEGATIVE  NEGATIVE mg/dL   Urobilinogen, UA 0.2  0.0 - 1.0 mg/dL   Nitrite NEGATIVE  NEGATIVE   Leukocytes, UA NEGATIVE  NEGATIVE  LACTIC ACID, PLASMA      Component Value Range   Lactic Acid, Venous 1.6  0.5 - 2.2 mmol/L   Ct Abdomen Pelvis W Contrast  11/24/2011  *RADIOLOGY REPORT*  Clinical Data: Abdominal pain and vomiting.  CT ABDOMEN AND PELVIS WITH CONTRAST  Technique:  Multidetector CT imaging of the abdomen and pelvis was performed following the standard protocol during bolus administration of intravenous contrast.  Contrast: OMNIPAQUE IOHEXOL 300  MG/ML  SOLN  Comparison: None  Findings: The lung bases are clear except for dependent atelectasis.  A 6 mm right lower lobe pulmonary nodule is noted. This is likely benign but does require follow-up.  There is a small subpleural nodule in the lingula on image number two.  Small nodule along the right major fissure is likely intrapulmonary lymph node. A repeat noncontrast chest CT in 6 months is suggested.  The liver is unremarkable.  No focal lesions or biliary dilatation. The gallbladder is normal.  No common bile duct dilatation.  The pancreas is normal.  The spleen is normal.  The adrenal glands and kidneys are normal.  The stomach, duodenum, small bowel and colon are unremarkable.  The appendix is distended and inflamed.  There appears to be an obstructing appendicolith measuring 1 cm proximally.  A second appendicolith is noted more distally.  The aorta is normal in caliber.  The major branch vessels are normal.  No mesenteric or retroperitoneal mass or adenopathy. Small scattered nodes are noted.  The uterus is surgically absent.  Both ovaries are still present and appear normal.  No pelvic mass, adenopathy or free pelvic fluid collections.  No inguinal mass or hernia.  The bladder is normal.  No significant bony findings.  IMPRESSION:  1.  CT findings consistent with acute appendicitis. 2.  Small pulmonary nodules at the lung bases.  Recommend follow-up noncontrast chest CT in 6 months.   Original Report Authenticated By: P. Loralie Champagne, M.D.       [place], [place] 11/24/11 1627

## 2011-11-24 NOTE — ED Provider Notes (Signed)
History     CSN: [id number]  Arrival date & time 11/24/11  0711   First MD Initiated Contact with Patient 11/24/11 0720      Chief Complaint  Patient presents with  . Abdominal Pain  . Emesis    Patient is a 46 y.o. female presenting with abdominal pain and vomiting. The history is provided by the patient.  Abdominal Pain The primary symptoms of the illness include abdominal pain, nausea, vomiting and diarrhea. The primary symptoms of the illness do not include fever, shortness of breath, hematemesis or dysuria. The current episode started 3 to 5 hours ago. The onset of the illness was sudden. The problem has been gradually worsening.  Additional symptoms associated with the illness include chills. Symptoms associated with the illness do not include back pain.  Emesis  Associated symptoms include abdominal pain, chills and diarrhea. Pertinent negatives include no fever.  Pt reports she woke up at approximately 2am with severe abdominal pain with episodes of vomiting/diarrhea.  No blood in stool/vomit.  No cp/sob.  No dysuria.  She is s/p hysterectomy.  She reports she had otherwise been well yesterday  Her only surgeries have been gynecologic in nature - c-section and hysterectomy She reports that she had a "blockage" that presented "just like this" several years ago at an outside hospital, but reports she was given laxatives.  No surgery performed.  Past Medical History  Diagnosis Date  . Asthma     no Inhaler use x84yrs    Past Surgical History  Procedure Date  . Cesarean section 1989  . Laparoscopic hysterectomy 04/13/2011    Procedure: HYSTERECTOMY TOTAL LAPAROSCOPIC;  Surgeon: Ok Edwards, MD;  Location: [facility];  Service: Gynecology;  Laterality: N/A;    Family History  Problem Relation Age of Onset  . Hypertension Maternal Grandmother   . Diabetes Other     History  Substance Use Topics  . Smoking status: Former Games developer  . Smokeless tobacco: Never Used   Comment: former passive smoker  . Alcohol Use: Yes     socially    OB History    Grav Para Term Preterm Abortions TAB SAB Ect Mult Living   2 1   1  1   2       Review of Systems  Constitutional: Positive for chills. Negative for fever.  Respiratory: Negative for shortness of breath.   Gastrointestinal: Positive for nausea, vomiting, abdominal pain and diarrhea. Negative for hematemesis.  Genitourinary: Negative for dysuria.  Musculoskeletal: Negative for back pain.  All other systems reviewed and are negative.    Allergies  Latex  Home Medications   Current Outpatient Rx  Name Route Sig Dispense Refill  . ALPRAZOLAM 0.25 MG PO TABS Oral Take 0.25 mg by mouth at bedtime as needed.    Marland Kitchen URIBEL 118 MG PO CAPS Oral Take 1 capsule (118 mg total) by mouth 4 (four) times daily. 8 capsule 0  . ZOLPIDEM TARTRATE ER 12.5 MG PO TBCR Oral Take 1 tablet (12.5 mg total) by mouth at bedtime as needed for sleep. 30 tablet 1    BP 161/89  Temp 97.5 F (36.4 C) (Oral)  Resp 20  SpO2 100%  LMP 03/28/2011  Physical Exam CONSTITUTIONAL: Well developed/well nourished, uncomfortable appearing HEAD AND FACE: Normocephalic/atraumatic EYES: EOMI/PERRL, no icterus ENMT: Mucous membranes moist NECK: supple no meningeal signs SPINE:entire spine nontender CV: S1/S2 noted, no murmurs/rubs/gallops noted LUNGS: Lungs are clear to auscultation bilaterally, no apparent distress ABDOMEN: soft, diffuse  tenderness, tenderness is moderate, no rebound or guarding GU:no cva tenderness NEURO: Pt is awake/alert, moves all extremitiesx4 EXTREMITIES: pulses normal, full ROM SKIN: warm, color normal PSYCH: no abnormalities of mood noted  ED Course  Procedures    Labs Reviewed  CBC WITH DIFFERENTIAL  BASIC METABOLIC PANEL  URINALYSIS, ROUTINE W REFLEX MICROSCOPIC  LACTIC ACID, PLASMA   7:46 AM Pt with vomiting/diarrhea and abdominal pain.  Suspect initially gastroenteritis, however she reports  some sort of "blockage" that occurred several yrs ago similar to this episode but details are limited.  Will obtain labs with focus on dehydration status and lactate.  May need imaging but will reassess after pain control.  Plan is to place in CDU holding to obtain labs   MDM  Nursing notes including past medical history and social history reviewed and considered in documentation Labs/vital reviewed and considered         Joya Gaskins, MD 11/24/11 1609

## 2011-11-24 NOTE — H&P (Signed)
To OR for lap appy.  Procedure, risks, benefits discussed and she agrees. Patient examined and I agree with the assessment and plan  Violeta Gelinas, MD, MPH, FACS Pager: [phone]  11/24/2011 3:55 PM

## 2011-11-24 NOTE — Preoperative (Signed)
Beta Blockers   Reason not to administer Beta Blockers:Not Applicable 

## 2011-11-24 NOTE — ED Notes (Signed)
Patient states abdominal pain and vomiting x 3 episodes starting around 0230 this morning

## 2011-11-24 NOTE — ED Notes (Signed)
Patient transported to CT 

## 2011-11-24 NOTE — Anesthesia Preprocedure Evaluation (Addendum)
Anesthesia Evaluation  Patient identified by MRN, date of birth, ID band Patient awake    Reviewed: Allergy & Precautions, H&P , NPO status , Patient's Chart, lab work & pertinent test results, reviewed documented beta blocker date and time   Airway Mallampati: II TM Distance: >3 FB Neck ROM: full    Dental  (+) Teeth Intact   Pulmonary asthma ,  breath sounds clear to auscultation        Cardiovascular negative cardio ROS  Rhythm:regular     Neuro/Psych negative neurological ROS  negative psych ROS   GI/Hepatic negative GI ROS, Neg liver ROS,   Endo/Other  Morbid obesity  Renal/GU negative Renal ROS  negative genitourinary   Musculoskeletal negative musculoskeletal ROS (+)   Abdominal   Peds  Hematology negative hematology ROS (+)   Anesthesia Other Findings See surgeon's H&P   Reproductive/Obstetrics negative OB ROS                         Anesthesia Physical Anesthesia Plan  ASA: III and Emergent  Anesthesia Plan: General   Post-op Pain Management:    Induction: Intravenous, Rapid sequence and Cricoid pressure planned  Airway Management Planned: Oral ETT  Additional Equipment:   Intra-op Plan:   Post-operative Plan: Extubation in OR  Informed Consent: I have reviewed the patients History and Physical, chart, labs and discussed the procedure including the risks, benefits and alternatives for the proposed anesthesia with the patient or authorized representative who has indicated his/her understanding and acceptance.   Dental Advisory Given and Dental advisory given  Plan Discussed with: CRNA and Surgeon  Anesthesia Plan Comments:       Anesthesia Quick Evaluation

## 2011-11-25 ENCOUNTER — Encounter (HOSPITAL_COMMUNITY): Payer: Self-pay | Admitting: General Surgery

## 2011-11-25 DIAGNOSIS — K3580 Unspecified acute appendicitis: Secondary | ICD-10-CM | POA: Diagnosis present

## 2011-11-25 HISTORY — DX: Unspecified acute appendicitis: K35.80

## 2011-11-25 LAB — CBC
MCV: 92 fL (ref 78.0–100.0)
Platelets: 210 10*3/uL (ref 150–400)
RDW: 13.3 % (ref 11.5–15.5)
WBC: 6.7 10*3/uL (ref 4.0–10.5)

## 2011-11-25 MED ORDER — MOXIFLOXACIN HCL 400 MG PO TABS
400.0000 mg | ORAL_TABLET | Freq: Every day | ORAL | Status: DC
  Administered 2011-11-25: 400 mg via ORAL
  Filled 2011-11-25 (×2): qty 1

## 2011-11-25 MED ORDER — MOXIFLOXACIN HCL 400 MG PO TABS: 400.0000 mg | ORAL_TABLET | Freq: Every day | ORAL | Status: DC

## 2011-11-25 MED ORDER — HYDROCODONE-ACETAMINOPHEN 5-325 MG PO TABS: 1.0000 | ORAL_TABLET | ORAL | Status: DC | PRN

## 2011-11-25 MED ORDER — IBUPROFEN 200 MG PO TABS: ORAL_TABLET | ORAL | Status: DC

## 2011-11-25 MED ORDER — CIPROFLOXACIN HCL 500 MG PO TABS
500.0000 mg | ORAL_TABLET | Freq: Two times a day (BID) | ORAL | Status: DC
Start: 2011-11-25 — End: 2012-01-30

## 2011-11-25 MED ORDER — ACETAMINOPHEN 325 MG PO TABS: 650.0000 mg | ORAL_TABLET | Freq: Four times a day (QID) | ORAL | Status: DC | PRN

## 2011-11-25 MED ORDER — METRONIDAZOLE 500 MG PO TABS: 500.0000 mg | ORAL_TABLET | Freq: Three times a day (TID) | ORAL | Status: DC

## 2011-11-25 NOTE — Progress Notes (Signed)
UR completed 

## 2011-11-25 NOTE — Progress Notes (Signed)
Discharge home.

## 2011-11-25 NOTE — ED Provider Notes (Signed)
Medical screening examination/treatment/procedure(s) were conducted as a shared visit with non-physician practitioner(s) and myself.  I personally evaluated the patient during the encounter   Joya Gaskins, MD 11/25/11 [phone]

## 2011-11-25 NOTE — Progress Notes (Signed)
1 Day Post-Op  Subjective: Sore this AM, did OK with clears last PM.  Overall feels much better  Objective: Vital signs in last 24 hours: Temp:  [97.3 F (36.3 C)-98.6 F (37 C)] 97.7 F (36.5 C) (10/18 0623) Pulse Rate:  [61-102] 69  (10/18 0623) Resp:  [14-21] 20  (10/18 0623) BP: (102-150)/(50-93) 102/50 mmHg (10/18 0623) SpO2:  [95 %-100 %] 97 % (10/18 0623) Weight:  [94.938 kg (209 lb 4.8 oz)] 94.938 kg (209 lb 4.8 oz) (10/17 1841) Last BM Date: 11/23/11  Afebrile, VSS  Intake/Output from previous day: 10/17 0701 - 10/18 0700 In: 3521.7 [P.O.:180; I.V.:3331.7; IV Piggyback:10] Out: 925 [Urine:900; Blood:25] Intake/Output this shift:    General appearance: alert, cooperative and no distress GI: soft, a little tender/sore, incisions look good.  Lab Results:   [facility] 11/24/11 0827  WBC 12.0*  HGB 14.6  HCT 42.4  PLT 249    BMET  Basename 11/24/11 0827  NA 137  K 3.7  CL 103  CO2 25  GLUCOSE 114*  BUN 14  CREATININE 0.66  CALCIUM 8.9   PT/INR No results found for this basename: LABPROT:2,INR:2 in the last 72 hours  No results found for this basename: AST:5,ALT:5,ALKPHOS:5,BILITOT:5,PROT:5,ALBUMIN:5 in the last 168 hours   Lipase  No results found for this basename: lipase     Studies/Results: Ct Abdomen Pelvis W Contrast  11/24/2011  *RADIOLOGY REPORT*  Clinical Data: Abdominal pain and vomiting.  CT ABDOMEN AND PELVIS WITH CONTRAST  Technique:  Multidetector CT imaging of the abdomen and pelvis was performed following the standard protocol during bolus administration of intravenous contrast.  Contrast: OMNIPAQUE IOHEXOL 300 MG/ML  SOLN  Comparison: None  Findings: The lung bases are clear except for dependent atelectasis.  A 6 mm right lower lobe pulmonary nodule is noted. This is likely benign but does require follow-up.  There is a small subpleural nodule in the lingula on image number two.  Small nodule along the right major fissure is likely  intrapulmonary lymph node. A repeat noncontrast chest CT in 6 months is suggested.  The liver is unremarkable.  No focal lesions or biliary dilatation. The gallbladder is normal.  No common bile duct dilatation.  The pancreas is normal.  The spleen is normal.  The adrenal glands and kidneys are normal.  The stomach, duodenum, small bowel and colon are unremarkable.  The appendix is distended and inflamed.  There appears to be an obstructing appendicolith measuring 1 cm proximally.  A second appendicolith is noted more distally.  The aorta is normal in caliber.  The major branch vessels are normal.  No mesenteric or retroperitoneal mass or adenopathy. Small scattered nodes are noted.  The uterus is surgically absent.  Both ovaries are still present and appear normal.  No pelvic mass, adenopathy or free pelvic fluid collections.  No inguinal mass or hernia.  The bladder is normal.  No significant bony findings.  IMPRESSION:  1.  CT findings consistent with acute appendicitis. 2.  Small pulmonary nodules at the lung bases.  Recommend follow-up noncontrast chest CT in 6 months.   Original Report Authenticated By: P. Loralie Champagne, M.D.     Medications:    . docusate sodium  100 mg Oral BID  . fentaNYL  100 mcg Intravenous Once  .  HYDROmorphone (DILAUDID) injection  1 mg Intravenous Once  .  HYDROmorphone (DILAUDID) injection  1 mg Intravenous Once  .  HYDROmorphone (DILAUDID) injection  1 mg Intravenous Once  .  HYDROmorphone (DILAUDID) injection  1 mg Intravenous NOW  . iohexol  20 mL Oral Q1 Hr x 2  . moxifloxacin  400 mg Intravenous Q24H  . ondansetron  4 mg Intravenous Once  . ondansetron  4 mg Intravenous Once  . pantoprazole (PROTONIX) IV  40 mg Intravenous QHS  . sodium chloride  1,000 mL Intravenous Once  . DISCONTD: sodium chloride   Intravenous Once  . DISCONTD: sodium chloride   Intravenous Once  . DISCONTD: moxifloxacin  400 mg Intravenous Once    Assessment/Plan acute appendicitis  s/p Lap Appy Asthma    Plan:  Advance diet, mobilize, hope to send home after lunch.  Send home on [place] lox for 1 week     LOS: 1 day    April Reed 11/25/2011

## 2011-11-25 NOTE — Discharge Summary (Signed)
April Salo, MD, MPH, FACS Pager: [phone]  

## 2011-11-25 NOTE — Discharge Summary (Signed)
Baillie Mohammad, MD, MPH, FACS Pager: [phone]  

## 2011-11-25 NOTE — Progress Notes (Signed)
Suppurative appendicitis Home on 1 week abx Violeta Gelinas, MD, MPH, FACS Pager: [phone]

## 2011-11-25 NOTE — Discharge Summary (Addendum)
Physician Discharge Summary  Patient ID: April Reed MRN: [id number] DOB/AGE: [birth year] 46 y.o.  Admit date: 11/24/2011 Discharge date: 11/25/2011  Admission Diagnoses: Acute appendicitis  Discharge Diagnoses: Same Principal Problem:  *Acute appendicitis   PROCEDURES: Laparoscopic Appendectomy, 11/24/11  Dr. Denton Brick Course: Patient states that she was awoken last night with sudden onset of N/V and RLQ and suprapubic abdominal pain. She denies any previous episodes of this type of abdominal pain.  She has not been able to keep any food down since last night. Her past Hx includes a C-section in 1989 and a abdominal hysterectomy done in March of this year done by Dr. Jodelle Green for symptomatic fibroids.  Medical history is positive for Asthma which she states is well controlled and has not needed an inhaler for a few weeks.  We are asked to see her for surgical intervention of her acute appendicitis. Dr. Janee Morn has seen and examined the patient, surgical risks were reviewed with the patient which included risk of bleeding and infection as well as possible complications from anesthesia was discussed with the patient. She verbalized understanding of these risks and has agreed to go forward with laparoscopic appendectomy  for her acute appendicitis.  CT report done 11/24/11:  Findings: The lung bases are clear except for dependent  atelectasis. A 6 mm right lower lobe pulmonary nodule is noted.  This is likely benign but does require follow-up. There is a small  subpleural nodule in the lingula on image number two. Small nodule  along the right major fissure is likely intrapulmonary lymph node.  A repeat noncontrast chest CT in 6 months is suggested.  The liver is unremarkable. No focal lesions or biliary dilatation.  The gallbladder is normal. No common bile duct dilatation. The  pancreas is normal. The spleen is normal. The adrenal glands and  kidneys are normal.  The  stomach, duodenum, small bowel and colon are unremarkable.  Pt was seen in the ER and taken to the OR later that day.  She tolerated the procedure well.  The following AM we are advancing her diet and if she does well plan home later this AM.  Incisions look good.  She had a suppurative appendix so we are sending her home on 1 week of antibiotics.  We have discussed the symptoms of post op abscess.   We plan to see her back in 2-3 weeks, [facility] clinic or Dr.Thompson.  Condition on D/C:  Improved.    Disposition: 01-Home or Self Care     Medication List     As of 11/25/2011  7:51 AM    TAKE these medications         acetaminophen 325 MG tablet   Commonly known as: TYLENOL   Take 2 tablets (650 mg total) by mouth every 6 (six) hours as needed (or Temp > 100).      HYDROcodone-acetaminophen 5-325 MG per tablet   Commonly known as: NORCO/VICODIN   Take 1-2 tablets by mouth every 4 (four) hours as needed.      ibuprofen 200 MG tablet   Commonly known as: ADVIL,MOTRIN   You can take 2-3 tablets every 6 hours as needed for pain.      loratadine 10 MG tablet   Commonly known as: CLARITIN   Take 10 mg by mouth daily as needed. For allergies      moxifloxacin 400 MG tablet   Commonly known as: AVELOX   Take 1 tablet (400 mg total) by  mouth daily at 8 pm.      multivitamin with minerals Tabs   Take 1 tablet by mouth daily.        Follow-up Information    Follow up with [facility] E, MD. Schedule an appointment as soon as possible for a visit in 2 weeks. (Call and ask for [facility] clinic or to see DR. Thompson 2-3 weeks)    Contact information:   [address] [place] [address] [phone]         Pt did not think she could afford Avelox so we are switching to Flagyl and Cipro for 1 week. SignedSherrie George 11/25/2011, 7:51 AM

## 2011-11-27 ENCOUNTER — Emergency Department (HOSPITAL_COMMUNITY)
Admission: EM | Admit: 2011-11-27 | Discharge: 2011-11-27 | Disposition: A | Discharge status: Home / Self Care | Payer: BC Managed Care – PPO | Attending: Emergency Medicine | Admitting: Emergency Medicine

## 2011-11-27 ENCOUNTER — Encounter (HOSPITAL_COMMUNITY): Payer: Self-pay | Admitting: Nurse Practitioner

## 2011-11-27 DIAGNOSIS — R059 Cough, unspecified: Secondary | ICD-10-CM | POA: Insufficient documentation

## 2011-11-27 DIAGNOSIS — R05 Cough: Secondary | ICD-10-CM | POA: Insufficient documentation

## 2011-11-27 DIAGNOSIS — J3489 Other specified disorders of nose and nasal sinuses: Secondary | ICD-10-CM | POA: Insufficient documentation

## 2011-11-27 NOTE — ED Notes (Signed)
For past few months, cough and congestion and noticed today there was some blood in her sputum. Taking mucinex with no relief. Concerned because she was here last week for emergency appendectomy and told she had nodules on her lungs that showed up on her CT abd. Pt reports hx asthma and chronic cough/cold due to same. Pt able to speak in full unlabored sentences in triage. Only c/o pain at site of appendectomy

## 2011-11-27 NOTE — ED Notes (Signed)
Unable to locate pt x 3

## 2011-11-27 NOTE — ED Notes (Signed)
Called for pt x 2, no answer

## 2011-11-27 NOTE — ED Notes (Signed)
Patient was called for at this time x1.  Will attempt again in a few minutes.

## 2012-01-30 ENCOUNTER — Emergency Department (INDEPENDENT_AMBULATORY_CARE_PROVIDER_SITE_OTHER): Payer: BC Managed Care – PPO

## 2012-01-30 ENCOUNTER — Emergency Department (HOSPITAL_COMMUNITY)
Admission: EM | Admit: 2012-01-30 | Discharge: 2012-01-30 | Disposition: A | Discharge status: Home / Self Care | Payer: BC Managed Care – PPO | Source: Home / Self Care | Attending: Emergency Medicine | Admitting: Emergency Medicine

## 2012-01-30 ENCOUNTER — Encounter (HOSPITAL_COMMUNITY): Payer: Self-pay | Admitting: Emergency Medicine

## 2012-01-30 DIAGNOSIS — R0789 Other chest pain: Secondary | ICD-10-CM

## 2012-01-30 DIAGNOSIS — R071 Chest pain on breathing: Secondary | ICD-10-CM

## 2012-01-30 DIAGNOSIS — J45909 Unspecified asthma, uncomplicated: Secondary | ICD-10-CM

## 2012-01-30 MED ORDER — ALBUTEROL SULFATE HFA 108 (90 BASE) MCG/ACT IN AERS: 2.0000 | INHALATION_SPRAY | Freq: Four times a day (QID) | RESPIRATORY_TRACT | Status: DC | PRN

## 2012-01-30 MED ORDER — MELOXICAM 7.5 MG PO TABS: 7.5000 mg | ORAL_TABLET | Freq: Every day | ORAL | Status: DC

## 2012-01-30 MED ORDER — ALBUTEROL SULFATE (5 MG/ML) 0.5% IN NEBU
5.0000 mg | INHALATION_SOLUTION | Freq: Once | RESPIRATORY_TRACT | Status: AC
  Administered 2012-01-30: 5 mg via RESPIRATORY_TRACT

## 2012-01-30 MED ORDER — ALBUTEROL SULFATE (5 MG/ML) 0.5% IN NEBU
INHALATION_SOLUTION | RESPIRATORY_TRACT | Status: AC
  Filled 2012-01-30: qty 1

## 2012-01-30 MED ORDER — PREDNISONE 20 MG PO TABS: 40.0000 mg | ORAL_TABLET | Freq: Every day | ORAL | Status: DC

## 2012-01-30 MED ORDER — ALBUTEROL SULFATE HFA 108 (90 BASE) MCG/ACT IN AERS: 1.0000 | INHALATION_SPRAY | Freq: Four times a day (QID) | RESPIRATORY_TRACT | Status: DC | PRN

## 2012-01-30 NOTE — ED Provider Notes (Signed)
History     CSN: [id number]  Arrival date & time 01/30/12  1003   First MD Initiated Contact with Patient 01/30/12 1019      Chief Complaint  Patient presents with  . Asthma  . Back Pain    (Consider location/radiation/quality/duration/timing/severity/associated sxs/prior treatment) HPI Comments: Presents to urgent care this morning, and she's been expressing symptoms for several weeks,as having a " respiratory problems" ( patient describes), with coughing sometimes with phlegm sometimes "dry", wheezing and shortness of breath, sinus congestion, postnasal drip. Has not had a fever although feels about 2 weeks ago had a cold with some fevers. Also been having soreness of her left breast and  left upper shoulder in the lateral aspect of her left arm. Discomfort seems to be exacerbated with cough and movement and when she raises her arm up. Denies any nausea vomiting or sweating. She has ran out of her albuterol has not been able to use albuterol. Patient describes that after her last surgery she was told she has some patches on her x-ray films?Marland Kitchen. That needed follow-up?Marland Kitchen She also describes that she sees a Dr. At [facility], but has not seen him for months.   At this point patient denies any pain at rest, denies any fevers shortness of breath as she is sitting in the exam table.  The history is provided by the patient.    Past Medical History  Diagnosis Date  . Asthma     no Inhaler use x38yrs  . Acute appendicitis 11/25/2011    Past Surgical History  Procedure Date  . Cesarean section 1989  . Laparoscopic hysterectomy 04/13/2011    Procedure: HYSTERECTOMY TOTAL LAPAROSCOPIC;  Surgeon: Ok Edwards, MD;  Location: [facility];  Service: Gynecology;  Laterality: N/A;  . Laparoscopic appendectomy 11/24/2011    Procedure: APPENDECTOMY LAPAROSCOPIC;  Surgeon: Liz Malady, MD;  Location: [facility] OR;  Service: General;  Laterality: N/A;    Family History  Problem Relation Age of  Onset  . Hypertension Maternal Grandmother   . Diabetes Other     History  Substance Use Topics  . Smoking status: Former Games developer  . Smokeless tobacco: Never Used     Comment: former passive smoker  . Alcohol Use: Yes     Comment: socially    OB History    Grav Para Term Preterm Abortions TAB SAB Ect Mult Living   2 1   1  1   2       Review of Systems  Constitutional: Positive for activity change. Negative for fever, chills, diaphoresis and fatigue.  Respiratory: Positive for cough, shortness of breath and wheezing.   Cardiovascular: Negative for palpitations and leg swelling.  Gastrointestinal: Negative for abdominal pain.  Skin: Negative for color change, pallor, rash and wound.  Neurological: Negative for dizziness and light-headedness.    Allergies  Latex  Home Medications   Current Outpatient Rx  Name  Route  Sig  Dispense  Refill  . ACETAMINOPHEN 325 MG PO TABS   Oral   Take 325 mg by mouth every 6 (six) hours as needed. pain         . ALBUTEROL SULFATE HFA 108 (90 BASE) MCG/ACT IN AERS   Inhalation   Inhale 2 puffs into the lungs every 6 (six) hours as needed. Shortness of breath         . IBUPROFEN 200 MG PO TABS   Oral   Take 200 mg by mouth every 6 (six) hours  as needed. headache         . CIPROFLOXACIN HCL 500 MG PO TABS   Oral   Take 1 tablet (500 mg total) by mouth 2 (two) times daily.   14 tablet   0   . HYDROCODONE-ACETAMINOPHEN 5-325 MG PO TABS   Oral   Take 1-2 tablets by mouth every 4 (four) hours as needed.   40 tablet   0   . LORATADINE 10 MG PO TABS   Oral   Take 10 mg by mouth daily as needed. For allergies         . METRONIDAZOLE 500 MG PO TABS   Oral   Take 1 tablet (500 mg total) by mouth 3 (three) times daily.   21 tablet   0     BP 128/84  Pulse 86  Temp 98.5 F (36.9 C) (Oral)  Resp 18  SpO2 100%  LMP 03/28/2011  Physical Exam  Vitals reviewed. Constitutional: She is oriented to person, place, and  time. Vital signs are normal. She appears well-developed and well-nourished. She is active.  Non-toxic appearance. She does not have a sickly appearance. No distress.  HENT:  Head: Normocephalic.  Eyes: Conjunctivae normal are normal. No scleral icterus.  Neck: Neck supple.  Cardiovascular: Normal rate and regular rhythm.  Exam reveals no gallop and no friction rub.   No murmur heard. Pulmonary/Chest: Effort normal. No respiratory distress. She has decreased breath sounds. She has no wheezes. She has no rhonchi. She has no rales. She exhibits tenderness.    Neurological: She is alert and oriented to person, place, and time.  Skin: No rash noted. No erythema.    ED Course  Procedures (including critical care time)  Labs Reviewed - No data to display No results found.   No diagnosis found.  EKG performed at 10:50 AM revealed a ventricular rate of 73 beats from and and normal sinus rhythm. No ST or T changes suggestive of acute ischemia PR and QRS interval within normal NO QT prolongation  MDM  Patient is somewhat multi-symptomatic, patient has multiple comorbidities and entries into her past medical history that she acknowledges  she admits that she has been seen at [facility], which she considers her main medical home. I have advised her to follow-up with her primary care doctor. Today's visit we're ruling out predominantly a pulmonary process such as pneumonia, as well as a coronary syndrome as patient has some atypical symptoms.  Jimmie Molly, MD 01/30/12 1158

## 2012-01-30 NOTE — ED Notes (Addendum)
Reports asthma problems and back pain.  Patient states back pain towards left shoulder is bothering her which started on Friday.  Patient took ibuprofen but pain came back.  Reports congestion and sinus problems for a couple of months now on and off.   Patient says its the weather.  Denies injury to back.

## 2013-08-14 ENCOUNTER — Encounter (HOSPITAL_COMMUNITY): Payer: Self-pay | Admitting: Emergency Medicine

## 2013-08-14 ENCOUNTER — Emergency Department (HOSPITAL_COMMUNITY)
Admission: EM | Admit: 2013-08-14 | Discharge: 2013-08-14 | Disposition: A | Discharge status: Home / Self Care | Payer: Managed Care, Other (non HMO) | Source: Home / Self Care | Attending: Family Medicine | Admitting: Family Medicine

## 2013-08-14 DIAGNOSIS — G479 Sleep disorder, unspecified: Secondary | ICD-10-CM

## 2013-08-14 DIAGNOSIS — R5381 Other malaise: Secondary | ICD-10-CM

## 2013-08-14 DIAGNOSIS — R5383 Other fatigue: Principal | ICD-10-CM

## 2013-08-14 LAB — POCT I-STAT, CHEM 8
BUN: 15 mg/dL (ref 6–23)
CALCIUM ION: 1.21 mmol/L (ref 1.12–1.23)
CREATININE: 0.7 mg/dL (ref 0.50–1.10)
Chloride: 101 mEq/L (ref 96–112)
GLUCOSE: 73 mg/dL (ref 70–99)
HCT: 49 % — ABNORMAL HIGH (ref 36.0–46.0)
HEMOGLOBIN: 16.7 g/dL — AB (ref 12.0–15.0)
Potassium: 4.4 mEq/L (ref 3.7–5.3)
Sodium: 140 mEq/L (ref 137–147)
TCO2: 26 mmol/L (ref 0–100)

## 2013-08-14 NOTE — Discharge Instructions (Signed)
Rest, monitor your blood pressure as desired, check with your doctor if needed.

## 2013-08-14 NOTE — ED Provider Notes (Signed)
CSN: [id number]     Arrival date & time 08/14/13  1355 History   First MD Initiated Contact with Patient 08/14/13 1522     Chief Complaint  Patient presents with  . Hypertension   (Consider location/radiation/quality/duration/timing/severity/associated sxs/prior Treatment) Patient is a 48 y.o. female presenting with hypertension. The history is provided by the patient.  Hypertension This is a new problem. The current episode started 6 to 12 hours ago. The problem has not changed since onset.Associated symptoms include headaches. Pertinent negatives include no chest pain and no abdominal pain. Associated symptoms comments: Lamonte Sakai and blurry vision today, working now 6d straight..    Past Medical History  Diagnosis Date  . Asthma     no Inhaler use x57yrs  . Acute appendicitis 11/25/2011   Past Surgical History  Procedure Laterality Date  . Cesarean section  1989  . Laparoscopic hysterectomy  04/13/2011    Procedure: HYSTERECTOMY TOTAL LAPAROSCOPIC;  Surgeon: Terrance Mass, MD;  Location: [facility];  Service: Gynecology;  Laterality: N/A;  . Laparoscopic appendectomy  11/24/2011    Procedure: APPENDECTOMY LAPAROSCOPIC;  Surgeon: Zenovia Jarred, MD;  Location: [facility];  Service: General;  Laterality: N/A;   Family History  Problem Relation Age of Onset  . Hypertension Maternal Grandmother   . Diabetes Other    History  Substance Use Topics  . Smoking status: Former Research scientist (life sciences)  . Smokeless tobacco: Never Used     Comment: former passive smoker  . Alcohol Use: Yes     Comment: socially   OB History   Grav Para Term Preterm Abortions TAB SAB Ect Mult Living   2 1   1  1   2      Review of Systems  Constitutional: Negative.   Respiratory: Negative for cough.   Cardiovascular: Negative for chest pain, palpitations and leg swelling.  Gastrointestinal: Negative for abdominal pain.  Neurological: Positive for headaches.    Allergies  Latex  Home Medications   Prior to Admission  medications   Medication Sig Start Date End Date Taking? Authorizing Provider  acetaminophen (TYLENOL) 325 MG tablet Take 325 mg by mouth every 6 (six) hours as needed. pain    Historical Provider, MD  albuterol (PROVENTIL HFA;VENTOLIN HFA) 108 (90 BASE) MCG/ACT inhaler Inhale 1-2 puffs into the lungs every 6 (six) hours as needed for wheezing. 01/30/12   Rosana Hoes, MD  albuterol (PROVENTIL HFA;VENTOLIN HFA) 108 (90 BASE) MCG/ACT inhaler Inhale 2 puffs into the lungs every 6 (six) hours as needed. Shortness of breath 01/30/12   Rosana Hoes, MD  loratadine (CLARITIN) 10 MG tablet Take 10 mg by mouth daily as needed. For allergies    Historical Provider, MD  meloxicam (MOBIC) 7.5 MG tablet Take 1 tablet (7.5 mg total) by mouth daily. 01/30/12   Rosana Hoes, MD  predniSONE (DELTASONE) 20 MG tablet Take 2 tablets (40 mg total) by mouth daily. 2 tablets daily for 5 days 01/30/12   Rosana Hoes, MD   BP 133/88  Pulse 82  Temp(Src) 98.2 F (36.8 C) (Oral)  Resp 18  SpO2 100%  LMP 03/28/2011 Physical Exam  Nursing note and vitals reviewed. Constitutional: She is oriented to person, place, and time. She appears well-developed and well-nourished.  Eyes: Conjunctivae are normal. Pupils are equal, round, and reactive to light.  Neck: Normal range of motion. Neck supple.  Cardiovascular: Regular rhythm and normal heart sounds.   Pulmonary/Chest: Effort normal and breath sounds normal.  Neurological: She is alert and  oriented to person, place, and time.  Skin: Skin is warm and dry.    ED Course  Procedures (including critical care time) Labs Review Labs Reviewed  POCT I-STAT, CHEM 8 - Abnormal; Notable for the following:    Hemoglobin 16.7 (*)    HCT 49.0 (*)    All other components within normal limits   i-stat wnl. Imaging Review No results found.   MDM   1. Fatigue due to sleep pattern disturbance        Billy Fischer, MD 08/14/13 [phone]

## 2013-08-14 NOTE — ED Notes (Signed)
C/o high blood pressure States she has a headache and blurry vision No hx of HTN

## 2013-09-13 ENCOUNTER — Other Ambulatory Visit: Payer: Self-pay

## 2013-09-13 DIAGNOSIS — Z1231 Encounter for screening mammogram for malignant neoplasm of breast: Secondary | ICD-10-CM

## 2013-12-09 ENCOUNTER — Encounter (HOSPITAL_COMMUNITY): Payer: Self-pay | Admitting: Emergency Medicine

## 2016-04-26 DIAGNOSIS — J45901 Unspecified asthma with (acute) exacerbation: Secondary | ICD-10-CM | POA: Diagnosis not present

## 2016-04-26 DIAGNOSIS — J180 Bronchopneumonia, unspecified organism: Secondary | ICD-10-CM | POA: Diagnosis not present

## 2016-05-18 DIAGNOSIS — H40003 Preglaucoma, unspecified, bilateral: Secondary | ICD-10-CM | POA: Diagnosis not present

## 2016-05-18 DIAGNOSIS — H40033 Anatomical narrow angle, bilateral: Secondary | ICD-10-CM | POA: Diagnosis not present

## 2016-05-30 ENCOUNTER — Other Ambulatory Visit: Payer: Self-pay | Admitting: Family Medicine

## 2016-05-30 DIAGNOSIS — Z1231 Encounter for screening mammogram for malignant neoplasm of breast: Secondary | ICD-10-CM

## 2016-05-30 DIAGNOSIS — Z0000 Encounter for general adult medical examination without abnormal findings: Secondary | ICD-10-CM | POA: Diagnosis not present

## 2016-06-02 DIAGNOSIS — Z131 Encounter for screening for diabetes mellitus: Secondary | ICD-10-CM | POA: Diagnosis not present

## 2016-06-02 DIAGNOSIS — Z0000 Encounter for general adult medical examination without abnormal findings: Secondary | ICD-10-CM | POA: Diagnosis not present

## 2016-06-02 DIAGNOSIS — Z13220 Encounter for screening for lipoid disorders: Secondary | ICD-10-CM | POA: Diagnosis not present

## 2016-06-23 ENCOUNTER — Ambulatory Visit
Admission: RE | Admit: 2016-06-23 | Discharge: 2016-06-23 | Disposition: A | Discharge status: Home / Self Care | Payer: 59 | Source: Ambulatory Visit | Attending: Family Medicine | Admitting: Family Medicine

## 2016-06-23 DIAGNOSIS — Z1231 Encounter for screening mammogram for malignant neoplasm of breast: Secondary | ICD-10-CM | POA: Diagnosis not present

## 2016-11-07 DIAGNOSIS — S83241A Other tear of medial meniscus, current injury, right knee, initial encounter: Secondary | ICD-10-CM | POA: Diagnosis not present

## 2016-11-07 DIAGNOSIS — M25561 Pain in right knee: Secondary | ICD-10-CM | POA: Diagnosis not present

## 2016-12-23 ENCOUNTER — Emergency Department
Admission: EM | Admit: 2016-12-23 | Discharge: 2016-12-24 | Disposition: A | Discharge status: Home / Self Care | Payer: Worker's Compensation | Attending: Emergency Medicine | Admitting: Emergency Medicine

## 2016-12-23 ENCOUNTER — Emergency Department: Payer: Worker's Compensation

## 2016-12-23 DIAGNOSIS — Y9301 Activity, walking, marching and hiking: Secondary | ICD-10-CM | POA: Insufficient documentation

## 2016-12-23 DIAGNOSIS — Z91040 Latex allergy status: Secondary | ICD-10-CM | POA: Diagnosis not present

## 2016-12-23 DIAGNOSIS — Y929 Unspecified place or not applicable: Secondary | ICD-10-CM | POA: Insufficient documentation

## 2016-12-23 DIAGNOSIS — Z791 Long term (current) use of non-steroidal anti-inflammatories (NSAID): Secondary | ICD-10-CM | POA: Diagnosis not present

## 2016-12-23 DIAGNOSIS — M25511 Pain in right shoulder: Secondary | ICD-10-CM

## 2016-12-23 DIAGNOSIS — W010XXA Fall on same level from slipping, tripping and stumbling without subsequent striking against object, initial encounter: Secondary | ICD-10-CM | POA: Insufficient documentation

## 2016-12-23 DIAGNOSIS — Y999 Unspecified external cause status: Secondary | ICD-10-CM | POA: Diagnosis not present

## 2016-12-23 DIAGNOSIS — S0993XA Unspecified injury of face, initial encounter: Secondary | ICD-10-CM | POA: Diagnosis not present

## 2016-12-23 DIAGNOSIS — S0990XA Unspecified injury of head, initial encounter: Secondary | ICD-10-CM

## 2016-12-23 DIAGNOSIS — W19XXXA Unspecified fall, initial encounter: Secondary | ICD-10-CM

## 2016-12-23 DIAGNOSIS — S0083XA Contusion of other part of head, initial encounter: Secondary | ICD-10-CM

## 2016-12-23 MED ORDER — ONDANSETRON 4 MG PO TBDP
4.0000 mg | ORAL_TABLET | Freq: Once | ORAL | Status: AC
  Administered 2016-12-24: 4 mg via ORAL
  Filled 2016-12-23: qty 1

## 2016-12-23 MED ORDER — HYDROCODONE-ACETAMINOPHEN 5-325 MG PO TABS
1.0000 | ORAL_TABLET | Freq: Once | ORAL | Status: AC
  Administered 2016-12-24: 1 via ORAL
  Filled 2016-12-23: qty 1

## 2016-12-23 MED ORDER — ONDANSETRON 4 MG PO TBDP: 4.0000 mg | ORAL_TABLET | Freq: Three times a day (TID) | ORAL | 0 refills | Status: DC | PRN

## 2016-12-23 MED ORDER — IBUPROFEN 800 MG PO TABS: 800.0000 mg | ORAL_TABLET | Freq: Three times a day (TID) | ORAL | 0 refills | Status: DC | PRN

## 2016-12-23 MED ORDER — HYDROCODONE-ACETAMINOPHEN 5-325 MG PO TABS: 1.0000 | ORAL_TABLET | Freq: Four times a day (QID) | ORAL | 0 refills | Status: DC | PRN

## 2016-12-23 MED ORDER — IBUPROFEN 800 MG PO TABS
800.0000 mg | ORAL_TABLET | Freq: Once | ORAL | Status: AC
  Administered 2016-12-24: 800 mg via ORAL
  Filled 2016-12-23: qty 1

## 2016-12-23 NOTE — ED Triage Notes (Signed)
Patient reports falling yesterday. Patient reports she struck her face, and c/o oral pain, left nasal pain. Patient reports he crown fell out and she chipped her tooth with fall. Patient c/o right shoulder pain after fall.

## 2016-12-23 NOTE — ED Provider Notes (Signed)
[facility] Emergency Department Provider Note   ____________________________________________   First MD Initiated Contact with Patient 12/23/16 2330     (approximate)  I have reviewed the triage vital signs and the nursing notes.   HISTORY  Chief Complaint Headache and Fall    HPI April Reed is a 51 y.o. female who presents to the ED from home with a chief complaint of fall with head and face pain.  Patient reports falling yesterday morning; slipped and fell in the rain and fell onto her face.  Denies LOC.  States that cap of her right front tooth fell off but she was able to replace it.  Reports her left central tooth is "pushed back".  Also complains of right shoulder pain.  Denies vision changes, neck pain, chest pain, shortness of breath, abdominal pain, nausea or vomiting.  Past Medical History:  Diagnosis Date  . Acute appendicitis 11/25/2011  . Asthma    no Inhaler use x45yrs    Patient Active Problem List   Diagnosis Date Noted  . Acute appendicitis 11/25/2011    Past Surgical History:  Procedure Laterality Date  . APPENDECTOMY LAPAROSCOPIC N/A 11/24/2011   Performed by Zenovia Jarred, MD at [facility]  . HYSTERECTOMY TOTAL LAPAROSCOPIC N/A 04/13/2011   Performed by Terrance Mass, MD at [facility]    Prior to Admission medications   Medication Sig Start Date End Date Taking? Authorizing Provider  acetaminophen (TYLENOL) 325 MG tablet Take 325 mg by mouth every 6 (six) hours as needed. pain    [provider]  albuterol (PROVENTIL HFA;VENTOLIN HFA) 108 (90 BASE) MCG/ACT inhaler Inhale 1-2 puffs into the lungs every 6 (six) hours as needed for wheezing. 01/30/12   Rosana Hoes, MD  albuterol (PROVENTIL HFA;VENTOLIN HFA) 108 (90 BASE) MCG/ACT inhaler Inhale 2 puffs into the lungs every 6 (six) hours as needed. Shortness of breath 01/30/12   Rosana Hoes, MD  HYDROcodone-acetaminophen (NORCO) 5-325 MG  tablet Take 1 tablet every 6 (six) hours as needed by mouth for moderate pain. 12/23/16   Paulette Blanch, MD  ibuprofen (ADVIL,MOTRIN) 800 MG tablet Take 1 tablet (800 mg total) every 8 (eight) hours as needed by mouth for moderate pain. 12/23/16   Paulette Blanch, MD  loratadine (CLARITIN) 10 MG tablet Take 10 mg by mouth daily as needed. For allergies    [provider]  meloxicam (MOBIC) 7.5 MG tablet Take 1 tablet (7.5 mg total) by mouth daily. 01/30/12   Rosana Hoes, MD  ondansetron (ZOFRAN ODT) 4 MG disintegrating tablet Take 1 tablet (4 mg total) every 8 (eight) hours as needed by mouth for nausea or vomiting. 12/23/16   Paulette Blanch, MD  predniSONE (DELTASONE) 20 MG tablet Take 2 tablets (40 mg total) by mouth daily. 2 tablets daily for 5 days 01/30/12   Rosana Hoes, MD    Allergies Latex  Family History  Problem Relation Age of Onset  . Diabetes Other   . Hypertension Maternal Grandmother   . Breast cancer Maternal Grandmother     Social History Social History   Tobacco Use  . Smoking status: Former Research scientist (life sciences)  . Smokeless tobacco: Never Used  . Tobacco comment: former passive smoker  Substance Use Topics  . Alcohol use: Yes    Comment: socially  . Drug use: No    Review of Systems  Constitutional: No fever/chills. Eyes: No visual changes. ENT: Positive for face  and dental pain.  No sore throat. Cardiovascular: Denies chest pain. Respiratory: Denies shortness of breath. Gastrointestinal: No abdominal pain.  No nausea, no vomiting.  No diarrhea.  No constipation. Genitourinary: Negative for dysuria. Musculoskeletal: Negative for back pain. Skin: Negative for rash. Neurological: Positive for headache.  Negative for focal weakness or numbness.   ____________________________________________   PHYSICAL EXAM:  VITAL SIGNS: ED Triage Vitals [12/23/16 2059]  Enc Vitals Group     BP 131/86     Pulse Rate 91     Resp 18     Temp 98.9 F (37.2 C)     Temp  Source Oral     SpO2 100 %     Weight 215 lb (97.5 kg)     Height 5\' 3"  (1.6 m)     Head Circumference      Peak Flow      Pain Score 10     Pain Loc      Pain Edu?      Excl. in [place]?     Constitutional: Alert and oriented. Well appearing and in no acute distress. Eyes: Conjunctivae are normal. PERRL. EOMI. Head: Atraumatic. Nose: No deformity noted. Mouth/Throat: No dental malocclusion.  Left upper central tooth mildly retracted. Neck: No stridor.  No cervical spine tenderness to palpation. Cardiovascular: Normal rate, regular rhythm. Grossly normal heart sounds.  Good peripheral circulation. Respiratory: Normal respiratory effort.  No retractions. Lungs CTAB. Gastrointestinal: Soft and nontender. No distention. No abdominal bruits. No CVA tenderness. Musculoskeletal: Right anterior shoulder tender to palpation.  Limited range of motion secondary to pain.  2+ radial pulses.  Brisk, less than 5-second capillary refill. Neurologic:  Normal speech and language. No gross focal neurologic deficits are appreciated. No gait instability. Skin:  Skin is warm, dry and intact. No rash noted. Psychiatric: Mood and affect are normal. Speech and behavior are normal.  ____________________________________________   LABS (all labs ordered are listed, but only abnormal results are displayed)  Labs Reviewed - No data to display ____________________________________________  EKG  None ____________________________________________  RADIOLOGY  Dg Shoulder Right  Result Date: 12/23/2016 CLINICAL DATA:  Fall yesterday with persistent shoulder pain, initial encounter EXAM: RIGHT SHOULDER - 2+ VIEW COMPARISON:  None. FINDINGS: There is no evidence of fracture or dislocation. There is no evidence of arthropathy or other focal bone abnormality. Soft tissues are unremarkable. IMPRESSION: No acute abnormality noted. Electronically Signed   By: Inez Catalina M.D.   On: 12/23/2016 21:21   Ct Head Wo  Contrast  Result Date: 12/23/2016 CLINICAL DATA:  Initial evaluation for recent trauma, fall. EXAM: CT HEAD WITHOUT CONTRAST CT MAXILLOFACIAL WITHOUT CONTRAST TECHNIQUE: Multidetector CT imaging of the head and maxillofacial structures were performed using the standard protocol without intravenous contrast. Multiplanar CT image reconstructions of the maxillofacial structures were also generated. COMPARISON:  None. FINDINGS: CT HEAD FINDINGS Brain: Cerebral volume normal for age. No acute intracranial hemorrhage. No evidence for acute large vessel territory infarct. No mass lesion, midline shift or mass effect. No hydrocephalus. No extra-axial fluid collection. Vascular: No hyperdense vessel. Skull: Scalp soft tissues and calvarium within normal limits. Other: Mastoid air cells are clear. CT MAXILLOFACIAL FINDINGS Osseous: Zygomatic arches intact. No acute maxillary fracture. Pterygoid plates intact. Nasal bones intact. Nasal septum midline and intact. Alveolar plate intact. No acute mandibular fracture. Mandibular condyles normally situated. Apparent loosening of the left maxillary central incisor, age indeterminate (series 7, image 28). Orbits: The globes intact. Intraorbital soft tissues within normal limits.  Bony orbits intact without orbital floor fracture. Sinuses: Prominent retention cyst largely fills the left maxillary sinus. Scattered mucoperiosteal thickening within the ethmoidal air cells and right maxillary sinus. No hemosinus. Soft tissues: No appreciable soft tissue injury about the face. IMPRESSION: 1. Normal head CT.  No acute intracranial process identified. 2. Age indeterminate loosening of the left maxillary central incisor. Correlation with physical exam recommended. 3. No other acute maxillofacial injury identified.  No fracture. 4. Left maxillary sinus retention cyst. Electronically Signed   By: Jeannine Boga M.D.   On: 12/23/2016 22:07   Ct Maxillofacial Wo Contrast  Result Date:  12/23/2016 CLINICAL DATA:  Initial evaluation for recent trauma, fall. EXAM: CT HEAD WITHOUT CONTRAST CT MAXILLOFACIAL WITHOUT CONTRAST TECHNIQUE: Multidetector CT imaging of the head and maxillofacial structures were performed using the standard protocol without intravenous contrast. Multiplanar CT image reconstructions of the maxillofacial structures were also generated. COMPARISON:  None. FINDINGS: CT HEAD FINDINGS Brain: Cerebral volume normal for age. No acute intracranial hemorrhage. No evidence for acute large vessel territory infarct. No mass lesion, midline shift or mass effect. No hydrocephalus. No extra-axial fluid collection. Vascular: No hyperdense vessel. Skull: Scalp soft tissues and calvarium within normal limits. Other: Mastoid air cells are clear. CT MAXILLOFACIAL FINDINGS Osseous: Zygomatic arches intact. No acute maxillary fracture. Pterygoid plates intact. Nasal bones intact. Nasal septum midline and intact. Alveolar plate intact. No acute mandibular fracture. Mandibular condyles normally situated. Apparent loosening of the left maxillary central incisor, age indeterminate (series 7, image 41). Orbits: The globes intact. Intraorbital soft tissues within normal limits. Bony orbits intact without orbital floor fracture. Sinuses: Prominent retention cyst largely fills the left maxillary sinus. Scattered mucoperiosteal thickening within the ethmoidal air cells and right maxillary sinus. No hemosinus. Soft tissues: No appreciable soft tissue injury about the face. IMPRESSION: 1. Normal head CT.  No acute intracranial process identified. 2. Age indeterminate loosening of the left maxillary central incisor. Correlation with physical exam recommended. 3. No other acute maxillofacial injury identified.  No fracture. 4. Left maxillary sinus retention cyst. Electronically Signed   By: Jeannine Boga M.D.   On: 12/23/2016 22:07     ____________________________________________   PROCEDURES  Procedure(s) performed: None  Procedures  Critical Care performed: No  ____________________________________________   INITIAL IMPRESSION / ASSESSMENT AND PLAN / ED COURSE  As part of my medical decision making, I reviewed the following data within the [place] History obtained from family, Nursing notes reviewed and incorporated, Radiograph reviewed and Notes from prior ED visits.   51 year old female who presents greater than 24 hours status post mechanical fall with postconcussive syndrome, right shoulder pain and dental injury.  She is neurologically intact without focal deficits.  Patient has a dentist and will follow-up for tooth injury.  Will place in shoulder sling, analgesia, antiemetic and will follow up closely with her PCP.  Strict head injury precautions given.  Patient and family members verbalize understanding and agree with plan of care.      ____________________________________________   FINAL CLINICAL IMPRESSION(S) / ED DIAGNOSES  Final diagnoses:  Fall, initial encounter  Injury of head, initial encounter  Contusion of face, initial encounter  Dental injury, initial encounter  Acute pain of right shoulder     ED Discharge Orders        Ordered    ibuprofen (ADVIL,MOTRIN) 800 MG tablet  Every 8 hours PRN     12/23/16 2343    HYDROcodone-acetaminophen (NORCO) 5-325 MG tablet  Every 6 hours PRN     12/23/16 2343    ondansetron (ZOFRAN ODT) 4 MG disintegrating tablet  Every 8 hours PRN     12/23/16 2343       Note:  This document was prepared using Dragon voice recognition software and may include unintentional dictation errors.    Paulette Blanch, MD 12/24/16 [phone]

## 2016-12-23 NOTE — Discharge Instructions (Signed)
1.  You may take medicines as needed for pain and nausea (Motrin/Norco/Zofran #15). 2.  Apply ice to affected area several times daily. 3.  Wear sling as needed for comfort. 4.  Return to the ER for worsening symptoms, persistent vomiting, lethargy or other concerns.

## 2016-12-24 NOTE — ED Notes (Signed)
Pt verbalizes understanding of discharge instructions.

## 2017-04-05 DIAGNOSIS — H40033 Anatomical narrow angle, bilateral: Secondary | ICD-10-CM | POA: Diagnosis not present

## 2017-07-11 DIAGNOSIS — J019 Acute sinusitis, unspecified: Secondary | ICD-10-CM | POA: Diagnosis not present

## 2017-07-28 ENCOUNTER — Other Ambulatory Visit: Payer: Self-pay | Admitting: Family Medicine

## 2017-07-28 DIAGNOSIS — Z1231 Encounter for screening mammogram for malignant neoplasm of breast: Secondary | ICD-10-CM

## 2017-08-17 ENCOUNTER — Ambulatory Visit
Admission: RE | Admit: 2017-08-17 | Discharge: 2017-08-17 | Disposition: A | Discharge status: Home / Self Care | Payer: 59 | Source: Ambulatory Visit | Attending: Family Medicine | Admitting: Family Medicine

## 2017-08-17 DIAGNOSIS — Z1231 Encounter for screening mammogram for malignant neoplasm of breast: Secondary | ICD-10-CM | POA: Diagnosis not present

## 2017-09-12 DIAGNOSIS — R0982 Postnasal drip: Secondary | ICD-10-CM | POA: Diagnosis not present

## 2017-10-04 DIAGNOSIS — R232 Flushing: Secondary | ICD-10-CM | POA: Diagnosis not present

## 2017-10-04 DIAGNOSIS — G4700 Insomnia, unspecified: Secondary | ICD-10-CM | POA: Diagnosis not present

## 2017-11-13 DIAGNOSIS — Z0000 Encounter for general adult medical examination without abnormal findings: Secondary | ICD-10-CM | POA: Diagnosis not present

## 2017-11-13 DIAGNOSIS — E785 Hyperlipidemia, unspecified: Secondary | ICD-10-CM | POA: Diagnosis not present

## 2017-11-30 DIAGNOSIS — D126 Benign neoplasm of colon, unspecified: Secondary | ICD-10-CM | POA: Diagnosis not present

## 2017-11-30 DIAGNOSIS — Z1211 Encounter for screening for malignant neoplasm of colon: Secondary | ICD-10-CM | POA: Diagnosis not present

## 2017-12-19 DIAGNOSIS — S83241A Other tear of medial meniscus, current injury, right knee, initial encounter: Secondary | ICD-10-CM | POA: Diagnosis not present

## 2017-12-19 DIAGNOSIS — M25561 Pain in right knee: Secondary | ICD-10-CM | POA: Diagnosis not present

## 2017-12-27 DIAGNOSIS — M25561 Pain in right knee: Secondary | ICD-10-CM | POA: Diagnosis not present

## 2018-01-02 DIAGNOSIS — M25562 Pain in left knee: Secondary | ICD-10-CM | POA: Diagnosis not present

## 2018-01-02 DIAGNOSIS — M25561 Pain in right knee: Secondary | ICD-10-CM | POA: Diagnosis not present

## 2018-04-19 DIAGNOSIS — J988 Other specified respiratory disorders: Secondary | ICD-10-CM | POA: Diagnosis not present

## 2018-04-19 DIAGNOSIS — J45901 Unspecified asthma with (acute) exacerbation: Secondary | ICD-10-CM | POA: Diagnosis not present

## 2018-04-30 ENCOUNTER — Observation Stay
Admission: EM | Admit: 2018-04-30 | Discharge: 2018-05-01 | Disposition: A | Discharge status: Home / Self Care | Payer: 59 | Attending: Internal Medicine | Admitting: Internal Medicine

## 2018-04-30 ENCOUNTER — Emergency Department: Payer: 59

## 2018-04-30 ENCOUNTER — Other Ambulatory Visit: Payer: Self-pay

## 2018-04-30 ENCOUNTER — Encounter: Payer: Self-pay | Admitting: Emergency Medicine

## 2018-04-30 DIAGNOSIS — Z91040 Latex allergy status: Secondary | ICD-10-CM

## 2018-04-30 DIAGNOSIS — J189 Pneumonia, unspecified organism: Principal | ICD-10-CM | POA: Insufficient documentation

## 2018-04-30 DIAGNOSIS — Z87891 Personal history of nicotine dependence: Secondary | ICD-10-CM

## 2018-04-30 DIAGNOSIS — R0602 Shortness of breath: Secondary | ICD-10-CM | POA: Diagnosis not present

## 2018-04-30 DIAGNOSIS — Z20828 Contact with and (suspected) exposure to other viral communicable diseases: Secondary | ICD-10-CM | POA: Diagnosis not present

## 2018-04-30 DIAGNOSIS — R079 Chest pain, unspecified: Secondary | ICD-10-CM | POA: Diagnosis not present

## 2018-04-30 DIAGNOSIS — R05 Cough: Secondary | ICD-10-CM | POA: Diagnosis not present

## 2018-04-30 DIAGNOSIS — J45909 Unspecified asthma, uncomplicated: Secondary | ICD-10-CM | POA: Diagnosis not present

## 2018-04-30 LAB — CBC WITH DIFFERENTIAL/PLATELET
Abs Immature Granulocytes: 0 10*3/uL (ref 0.00–0.07)
BASOS ABS: 0.1 10*3/uL (ref 0.0–0.1)
Basophils Relative: 2 %
EOS ABS: 0.5 10*3/uL (ref 0.0–0.5)
EOS PCT: 11 %
HCT: 42.3 % (ref 36.0–46.0)
Hemoglobin: 13.8 g/dL (ref 12.0–15.0)
Immature Granulocytes: 0 %
LYMPHS PCT: 35 %
Lymphs Abs: 1.7 10*3/uL (ref 0.7–4.0)
MCH: 30.7 pg (ref 26.0–34.0)
MCHC: 32.6 g/dL (ref 30.0–36.0)
MCV: 94.2 fL (ref 80.0–100.0)
Monocytes Absolute: 0.5 10*3/uL (ref 0.1–1.0)
Monocytes Relative: 11 %
NEUTROS PCT: 41 %
NRBC: 0 % (ref 0.0–0.2)
Neutro Abs: 1.9 10*3/uL (ref 1.7–7.7)
Platelets: 281 10*3/uL (ref 150–400)
RBC: 4.49 MIL/uL (ref 3.87–5.11)
RDW: 12.7 % (ref 11.5–15.5)
WBC: 4.7 10*3/uL (ref 4.0–10.5)

## 2018-04-30 LAB — CBC
HEMATOCRIT: 42.7 % (ref 36.0–46.0)
HEMOGLOBIN: 13.9 g/dL (ref 12.0–15.0)
MCH: 30.6 pg (ref 26.0–34.0)
MCHC: 32.6 g/dL (ref 30.0–36.0)
MCV: 94.1 fL (ref 80.0–100.0)
NRBC: 0 % (ref 0.0–0.2)
Platelets: 259 10*3/uL (ref 150–400)
RBC: 4.54 MIL/uL (ref 3.87–5.11)
RDW: 12.6 % (ref 11.5–15.5)
WBC: 5 10*3/uL (ref 4.0–10.5)

## 2018-04-30 LAB — COMPREHENSIVE METABOLIC PANEL
ALBUMIN: 3.8 g/dL (ref 3.5–5.0)
ALK PHOS: 95 U/L (ref 38–126)
ALT: 24 U/L (ref 0–44)
AST: 25 U/L (ref 15–41)
Anion gap: 7 (ref 5–15)
BUN: 20 mg/dL (ref 6–20)
CALCIUM: 8.6 mg/dL — AB (ref 8.9–10.3)
CHLORIDE: 105 mmol/L (ref 98–111)
CO2: 26 mmol/L (ref 22–32)
CREATININE: 0.63 mg/dL (ref 0.44–1.00)
GFR calc Af Amer: >60 mL/min (ref >60)
GFR calc non Af Amer: >60 mL/min (ref >60)
GLUCOSE: 95 mg/dL (ref 70–99)
Potassium: 4.1 mmol/L (ref 3.5–5.1)
SODIUM: 138 mmol/L (ref 135–145)
Total Bilirubin: 0.5 mg/dL (ref 0.3–1.2)
Total Protein: 7.6 g/dL (ref 6.5–8.1)

## 2018-04-30 LAB — URINALYSIS, COMPLETE (UACMP) WITH MICROSCOPIC
BILIRUBIN URINE: NEGATIVE
GLUCOSE, UA: NEGATIVE mg/dL
HGB URINE DIPSTICK: NEGATIVE
Ketones, ur: NEGATIVE mg/dL
Leukocytes,Ua: NEGATIVE
Nitrite: NEGATIVE
PH: 6 (ref 5.0–8.0)
Protein, ur: NEGATIVE mg/dL
SPECIFIC GRAVITY, URINE: 1.006 (ref 1.005–1.030)

## 2018-04-30 LAB — INFLUENZA PANEL BY PCR (TYPE A & B)
Influenza A By PCR: NEGATIVE
Influenza B By PCR: NEGATIVE

## 2018-04-30 LAB — PROCALCITONIN: Procalcitonin: <0.1 ng/mL

## 2018-04-30 LAB — LACTIC ACID, PLASMA: LACTIC ACID, VENOUS: 0.9 mmol/L (ref 0.5–1.9)

## 2018-04-30 LAB — TROPONIN I

## 2018-04-30 MED ORDER — LEVOFLOXACIN IN D5W 750 MG/150ML IV SOLN
750.0000 mg | INTRAVENOUS | Status: DC
  Administered 2018-05-01: 750 mg via INTRAVENOUS
  Filled 2018-04-30: qty 150

## 2018-04-30 MED ORDER — GUAIFENESIN ER 600 MG PO TB12
600.0000 mg | ORAL_TABLET | Freq: Two times a day (BID) | ORAL | Status: DC
  Administered 2018-04-30 – 2018-05-01 (×2): 600 mg via ORAL
  Filled 2018-04-30 (×2): qty 1

## 2018-04-30 MED ORDER — TRAMADOL HCL 50 MG PO TABS
50.0000 mg | ORAL_TABLET | Freq: Four times a day (QID) | ORAL | Status: DC | PRN
  Administered 2018-04-30: 50 mg via ORAL
  Filled 2018-04-30: qty 1

## 2018-04-30 MED ORDER — SODIUM CHLORIDE 0.9 % IV SOLN
INTRAVENOUS | Status: DC
  Administered 2018-04-30 – 2018-05-01 (×2): via INTRAVENOUS

## 2018-04-30 MED ORDER — ONDANSETRON HCL 4 MG PO TABS: 4.0000 mg | ORAL_TABLET | Freq: Four times a day (QID) | ORAL | Status: DC | PRN

## 2018-04-30 MED ORDER — ALBUTEROL SULFATE HFA 108 (90 BASE) MCG/ACT IN AERS
1.0000 | INHALATION_SPRAY | Freq: Four times a day (QID) | RESPIRATORY_TRACT | Status: DC | PRN
  Filled 2018-04-30: qty 6.7

## 2018-04-30 MED ORDER — SODIUM CHLORIDE 0.9% FLUSH: 3.0000 mL | INTRAVENOUS | Status: DC | PRN

## 2018-04-30 MED ORDER — LEVOFLOXACIN IN D5W 750 MG/150ML IV SOLN
750.0000 mg | Freq: Once | INTRAVENOUS | Status: AC
  Administered 2018-04-30: 750 mg via INTRAVENOUS
  Filled 2018-04-30: qty 150

## 2018-04-30 MED ORDER — ASPIRIN 81 MG PO CHEW
324.0000 mg | CHEWABLE_TABLET | Freq: Once | ORAL | Status: AC
  Administered 2018-04-30: 324 mg via ORAL
  Filled 2018-04-30: qty 4

## 2018-04-30 MED ORDER — ENOXAPARIN SODIUM 40 MG/0.4ML ~~LOC~~ SOLN
40.0000 mg | SUBCUTANEOUS | Status: DC
  Administered 2018-04-30: 40 mg via SUBCUTANEOUS
  Filled 2018-04-30: qty 0.4

## 2018-04-30 MED ORDER — SODIUM CHLORIDE 0.9 % IV BOLUS
1000.0000 mL | Freq: Once | INTRAVENOUS | Status: AC
  Administered 2018-04-30: 1000 mL via INTRAVENOUS

## 2018-04-30 MED ORDER — ACETAMINOPHEN 325 MG PO TABS: 650.0000 mg | ORAL_TABLET | Freq: Four times a day (QID) | ORAL | Status: DC | PRN

## 2018-04-30 MED ORDER — MELATONIN 5 MG PO TABS
10.0000 mg | ORAL_TABLET | Freq: Every evening | ORAL | Status: DC | PRN
  Administered 2018-04-30: 10 mg via ORAL
  Filled 2018-04-30 (×2): qty 2

## 2018-04-30 MED ORDER — ACETAMINOPHEN 650 MG RE SUPP: 650.0000 mg | Freq: Four times a day (QID) | RECTAL | Status: DC | PRN

## 2018-04-30 MED ORDER — ONDANSETRON HCL 4 MG/2ML IJ SOLN: 4.0000 mg | Freq: Four times a day (QID) | INTRAMUSCULAR | Status: DC | PRN

## 2018-04-30 MED ORDER — SODIUM CHLORIDE 0.9% FLUSH: 3.0000 mL | Freq: Two times a day (BID) | INTRAVENOUS | Status: DC

## 2018-04-30 NOTE — Consult Note (Signed)
Pharmacy Antibiotic Note  April Reed is a 53 y.o. female admitted on 04/30/2018 with pneumonia.  Pharmacy has been consulted for Levaquin dosing.  Plan: Will dose Levaquin 750mg  q24 hours  Height: 5\' 3"  (160 cm) Weight: 220 lb (99.8 kg) IBW/kg (Calculated) : 52.4  Temp (24hrs), Avg:97.7 F (36.5 C), Min:97.7 F (36.5 C), Max:97.7 F (36.5 C)  Recent Labs  Lab 04/30/18 0948  WBC 4.7  5.0  CREATININE 0.63    Estimated Creatinine Clearance: 93.8 mL/min (by C-G formula based on SCr of 0.63 mg/dL).    Allergies  Allergen Reactions  . Latex Swelling and Rash    Antimicrobials this admission: Levaquin 3/23 >>  Dose adjustments this admission: None  Microbiology results: 3/23 Covid-19 >> pending 3/23 BCx: >> pending 3/23 UCx: >> pending   Thank you for allowing pharmacy to be a part of this patient's care.  Lu Duffel, PharmD, BCPS Clinical Pharmacist 04/30/2018 12:22 PM

## 2018-04-30 NOTE — ED Provider Notes (Addendum)
[facility] Emergency Department Provider Note  ____________________________________________  Time seen: Approximately 9:40 AM  I have reviewed the triage vital signs and the nursing notes.   HISTORY  Chief Complaint Chest Pain    HPI April Reed is a 53 y.o. female with a history of asthma presenting with cough, congestion and rhinorrhea and sore throat, now resolved, and shortness of breath.  The patient reports that she returned from a cruise in the [place] 04/15/2018.  Afterwards, her daughter with whom she traveled developed fever and cough, and her symptoms have completely resolved.  10 days ago, the patient began to have a cough with congestion and rhinorrhea, sore throat, and was seen by her PMD who prescribed her a Z-Pak.  She was also prescribed an albuterol inhaler and a steroid inhaler, which she has been taking.  Since then, her cough has persisted and her congestion and rhinorrhea and sore throat have resolved.  She has not developed any fever or shaking chills.  She has not had any GI symptoms including nausea vomiting or diarrhea.  She does report that she feels exertional shortness of breath.  For the past 3 days, the patient has been having a dull ache that started in the right chest, and then now is in the left chest and the left arm; it is constant and not related to strain or exertion.  No lower extremity swelling or calf pain.  Past Medical History:  Diagnosis Date  . Acute appendicitis 11/25/2011  . Asthma    no Inhaler use x20yrs    Patient Active Problem List   Diagnosis Date Noted  . Acute appendicitis 11/25/2011    Past Surgical History:  Procedure Laterality Date  . CESAREAN SECTION  1989  . LAPAROSCOPIC APPENDECTOMY  11/24/2011   Procedure: APPENDECTOMY LAPAROSCOPIC;  Surgeon: Zenovia Jarred, MD;  Location: [place];  Service: General;  Laterality: N/A;  . LAPAROSCOPIC HYSTERECTOMY  04/13/2011   Procedure: HYSTERECTOMY TOTAL  LAPAROSCOPIC;  Surgeon: Terrance Mass, MD;  Location: [facility];  Service: Gynecology;  Laterality: N/A;    Current Outpatient Rx  . Order #: [id number] Class: Historical Med  . Order #: [id number] Class: Normal  . Order #: [id number] Class: Print  . Order #: [id number] Class: Print  . Order #: [id number] Class: Historical Med  . Order #: [id number] Class: Print  . Order #: [id number] Class: Print  . Order #: [id number] Class: Print    Allergies Latex  Family History  Problem Relation Age of Onset  . Diabetes Other   . Hypertension Maternal Grandmother   . Breast cancer Maternal Grandmother     Social History Social History   Tobacco Use  . Smoking status: Former Research scientist (life sciences)  . Smokeless tobacco: Never Used  . Tobacco comment: former passive smoker  Substance Use Topics  . Alcohol use: Yes    Comment: socially  . Drug use: No    Review of Systems Constitutional: No fever/chills.  No lightheadedness or syncope. Eyes: No visual changes. ENT: Positive congestion, rhinorrhea and sore throat, now resolved. Cardiovascular: As of right and left-sided chest pain. Denies palpitations. Respiratory: Positive shortness of breath.  Positive cough. Gastrointestinal: No abdominal pain.  No nausea, no vomiting.  No diarrhea.  No constipation. Genitourinary: Negative for dysuria. Musculoskeletal: Negative for back pain.  No lower extremity swelling or calf pain. Skin: Negative for rash. Neurological: Negative for headaches. No focal numbness, tingling or weakness.     ____________________________________________   PHYSICAL EXAM:  VITAL SIGNS: ED Triage Vitals  Enc Vitals Group     BP 04/30/18 0900 119/89     Pulse Rate 04/30/18 0900 83     Resp 04/30/18 0900 18     Temp 04/30/18 0900 97.7 F (36.5 C)     Temp Source 04/30/18 0900 Oral     SpO2 04/30/18 0900 100 %     Weight 04/30/18 0858 220 lb (99.8 kg)     Height 04/30/18 0858 5\' 3"  (1.6 m)     Head Circumference --      Peak Flow --       Pain Score 04/30/18 0858 6     Pain Loc --      Pain Edu? --      Excl. in [place]? --     Constitutional: Alert and oriented.  Answers questions appropriately. Eyes: Conjunctivae are normal.  EOMI. No scleral icterus.  No eye discharge. Head: Atraumatic. Nose: No congestion. Neck: No stridor.  Supple.  No JVD.  No meningismus. Cardiovascular: Normal rate, regular rhythm. No murmurs, rubs or gallops.  Respiratory: Normal respiratory effort.  No accessory muscle use or retractions. Lungs CTAB.  No wheezes, rales or ronchi. Gastrointestinal: Overweight.  Soft, nontender and nondistended.  No guarding or rebound.  No peritoneal signs. Musculoskeletal: No LE edema. No ttp in the calves or palpable cords.  Negative Homan's sign. Neurologic:  A&Ox3.  Speech is clear.  Face and smile are symmetric.  EOMI.  Moves all extremities well. Skin:  Skin is warm, dry and intact. No rash noted. Psychiatric: Mood and affect are normal. Speech and behavior are normal.  Normal judgement  ____________________________________________   LABS (all labs ordered are listed, but only abnormal results are displayed)  Labs Reviewed  COMPREHENSIVE METABOLIC PANEL - Abnormal; Notable for the following components:      Result Value   Calcium 8.6 (*)    All other components within normal limits  CULTURE, BLOOD (ROUTINE X 2)  CULTURE, BLOOD (ROUTINE X 2)  URINE CULTURE  CBC  TROPONIN I  CBC WITH DIFFERENTIAL/PLATELET  LACTIC ACID, PLASMA  LACTIC ACID, PLASMA  BLOOD GAS, VENOUS  URINALYSIS, COMPLETE (UACMP) WITH MICROSCOPIC  INFLUENZA PANEL BY PCR (TYPE A & B)  PROCALCITONIN   ____________________________________________  EKG  EKG is read and interpreted by me, performed at 8:50 AM with a rate of 81, normal sinus rhythm, normal intervals, normal axis, no STEMI.  ____________________________________________  RADIOLOGY  Dg Chest Portable 1 View  Result Date: 04/30/2018 CLINICAL DATA:  Cough and shortness  of breath, history asthma EXAM: PORTABLE CHEST 1 VIEW COMPARISON:  Portable exam 0937 hours compared to 01/30/2012 FINDINGS: Upper normal heart size. Mediastinal contours and pulmonary vascularity normal. Patchy opacities in the upper lobes question favor pneumonia on RIGHT and atelectasis versus pneumonia on LEFT. No pleural effusion or pneumothorax. Osseous structures unremarkable. IMPRESSION: Question RIGHT upper lobe pneumonia with atelectasis versus infiltrate additionally seen in LEFT upper lobe. Electronically Signed   By: Lavonia Dana M.D.   On: 04/30/2018 09:58    ____________________________________________   PROCEDURES  Procedure(s) performed: None  Procedures  Critical Care performed: No ____________________________________________   INITIAL IMPRESSION / ASSESSMENT AND PLAN / ED COURSE  Pertinent labs & imaging results that were available during my care of the patient were reviewed by me and considered in my medical decision making (see chart for details).  53 y.o. female with a history of asthma presenting with cough, congestion and rhinorrhea, sore throat, after a cruise to the [place],  not improved with azithromycin.  Overall, the patient has reassuring vital signs and is afebrile with 100% oxygen saturation on room air here.  My pulmonary exam does not show any abnormalities.  However, I am concerned about her travel history, and her sick contact history for coronavirus.  She does not meet criteria for testing today, but I have explained that she will need to initiate self isolation today and we will give her specific instruction in her discharge paperwork.  Her chest pain is atypical and she does not have many personal risk factors; will do screening EKG and a single troponin given that the chest pain onset was several days ago and it has been constant.  An infectious pathology is much more likely, including nonspecific viral URI resulting in asthma exacerbation.  I would also  consider coronavirus.  Given that overall she is well-appearing and the data is suggestive that steroids may worsen coronavirus course, I will have her continue her steroid inhaler but not give her any additional oral steroids at this time.  I have talked to her extensively about return precautions.  Plan reevaluation for final disposition.  ----------------------------------------- 10:40 AM on 04/30/2018 -----------------------------------------  The patient's work-up in the emergency department is concerning for bilateral pneumonia.  Her white blood cell count is 5.0, and her differential is pending at this time.  Her hepatic function panel is also pending but her electrolytes are reassuring.  Her troponin is less than 0.03.  This patient has failed outpatient therapy for pneumonia and does not meet criteria for sepsis at this time, but will be brought into the hospital for IV antibiotics and blood cultures and lactic acid are pending.  Given her travel history, and symptoms, as well as bilateral pneumonia, I will test this patient for coronavirus today.  ____________________________________________  FINAL CLINICAL IMPRESSION(S) / ED DIAGNOSES  Final diagnoses:  Pneumonia of both lungs due to infectious organism, unspecified part of lung         NEW MEDICATIONS STARTED DURING THIS VISIT:  New Prescriptions   No medications on file      Eula Listen, MD 04/30/18 0945    Eula Listen, MD 04/30/18 1050    Eula Listen, MD 04/30/18 1051

## 2018-04-30 NOTE — ED Notes (Signed)
Patient transported to room 229

## 2018-04-30 NOTE — Consult Note (Signed)
NAME: April Reed  DOB: [birth year]  MRN: [id number]  Date/Time: 04/30/2018 4:10 PM  REQUESTING PROVIDER:patel Subjective:  REASON FOR CONSULT: COVID 19 ? April Reed is a 53 y.o. female with a history of asthma Presents with cough and chest pain. Was on a cruise to [place]  and returned on 04/15/18 , Then her daughter who is 13 yrs old got sick with cough and cold , followed by patient and she went to [facility] and was given zpak last week. She was not getting better and hence came to ED. No fever. In the ED vitals normal, Pulse Ox N, CXR possible b/l infiltrate non specific- admitted for pneumonia Pt is playing game on her phone. She is feeling better- says she has asthma  Past Medical History:  Diagnosis Date  . Acute appendicitis 11/25/2011  . Asthma    no Inhaler use x76yrs    Past Surgical History:  Procedure Laterality Date  . CESAREAN SECTION  1989  . LAPAROSCOPIC APPENDECTOMY  11/24/2011   Procedure: APPENDECTOMY LAPAROSCOPIC;  Surgeon: Zenovia Jarred, MD;  Location: [place];  Service: General;  Laterality: N/A;  . LAPAROSCOPIC HYSTERECTOMY  04/13/2011   Procedure: HYSTERECTOMY TOTAL LAPAROSCOPIC;  Surgeon: Terrance Mass, MD;  Location: [facility];  Service: Gynecology;  Laterality: N/A;    Social History   Socioeconomic History  . Marital status: Single    Spouse name: Not on file  . Number of children: Not on file  . Years of education: Not on file  . Highest education level: Not on file  Occupational History  . Not on file  Social Needs  . Financial resource strain: Not on file  . Food insecurity:    Worry: Not on file    Inability: Not on file  . Transportation needs:    Medical: Not on file    Non-medical: Not on file  Tobacco Use  . Smoking status: Former Research scientist (life sciences)  . Smokeless tobacco: Never Used  . Tobacco comment: former passive smoker  Substance and Sexual Activity  . Alcohol use: Yes    Comment: socially  . Drug use: No  . Sexual activity: Yes   Birth control/protection: None  Lifestyle  . Physical activity:    Days per week: Not on file    Minutes per session: Not on file  . Stress: Not on file  Relationships  . Social connections:    Talks on phone: Not on file    Gets together: Not on file    Attends religious service: Not on file    Active member of club or organization: Not on file    Attends meetings of clubs or organizations: Not on file    Relationship status: Not on file  . Intimate partner violence:    Fear of current or ex partner: Not on file    Emotionally abused: Not on file    Physically abused: Not on file    Forced sexual activity: Not on file  Other Topics Concern  . Not on file  Social History Narrative  . Not on file    Family History  Problem Relation Age of Onset  . Diabetes Other   . Hypertension Maternal Grandmother   . Breast cancer Maternal Grandmother    Allergies  Allergen Reactions  . Latex Swelling and Rash  ? Current Facility-Administered Medications  Medication Dose Route Frequency Provider Last Rate Last Dose  . 0.9 %  sodium chloride infusion   Intravenous Continuous Dustin Flock, MD 75  mL/hr at 04/30/18 1414    . acetaminophen (TYLENOL) tablet 650 mg  650 mg Oral Q6H PRN Dustin Flock, MD       Or  . acetaminophen (TYLENOL) suppository 650 mg  650 mg Rectal Q6H PRN Dustin Flock, MD      . albuterol (PROVENTIL HFA;VENTOLIN HFA) 108 (90 Base) MCG/ACT inhaler 1-2 puff  1-2 puff Inhalation Q6H PRN Dustin Flock, MD      . enoxaparin (LOVENOX) injection 40 mg  40 mg Subcutaneous Q24H Dustin Flock, MD      . guaiFENesin (MUCINEX) 12 hr tablet 600 mg  600 mg Oral BID Dustin Flock, MD      . Derrill Memo ON 05/01/2018] levofloxacin (LEVAQUIN) IVPB 750 mg  750 mg Intravenous Q24H Shanlever, Charles M, RPH      . ondansetron [facility]) tablet 4 mg  4 mg Oral Q6H PRN Dustin Flock, MD       Or  . ondansetron (ZOFRAN) injection 4 mg  4 mg Intravenous Q6H PRN Dustin Flock, MD       . sodium chloride flush (NS) 0.9 % injection 3 mL  3 mL Intravenous Q12H Dustin Flock, MD      . sodium chloride flush (NS) 0.9 % injection 3 mL  3 mL Intravenous PRN Dustin Flock, MD      . traMADol Veatrice Bourbon) tablet 50 mg  50 mg Oral Q6H PRN Dustin Flock, MD         Abtx:  Anti-infectives (From admission, onward)   Start     Dose/Rate Route Frequency Ordered Stop   05/01/18 0800  levofloxacin (LEVAQUIN) IVPB 750 mg     750 mg 100 mL/hr over 90 Minutes Intravenous Every 24 hours 04/30/18 1224     04/30/18 1045  levofloxacin (LEVAQUIN) IVPB 750 mg     750 mg 100 mL/hr over 90 Minutes Intravenous  Once 04/30/18 1039 04/30/18 1315      REVIEW OF SYSTEMS:  Const: negative fever, negative chills, negative weight loss Eyes: negative diplopia or visual changes, negative eye pain ENT: negative coryza, negative sore throat Resp:  cough, --hemoptysis,+ dyspnea Cards: negative for chest pain, palpitations, lower extremity edema GU: negative for frequency, dysuria and hematuria GI: Negative for abdominal pain, diarrhea, bleeding, constipation Skin: negative for rash and pruritus Heme: negative for easy bruising and gum/nose bleeding MS: negative for myalgias, arthralgias, back pain and muscle weakness Neurolo:negative for headaches, dizziness, vertigo, memory problems  Psych: negative for feelings of anxiety, depression  Endocrine: no polyuria Allergy/Immunology- negative for any medication or food allergies  Objective:  VITALS:  BP (!) 151/87 (BP Location: Left Arm)   Pulse 75   Temp 97.7 F (36.5 C) (Oral)   Resp 14   Ht 5\' 3"  (1.6 m)   Wt 99.8 kg   LMP 03/28/2011   SpO2 100%   BMI 38.97 kg/m  PHYSICAL EXAM:  General: Alert, cooperative, no distress, appears stated age.  Head: Normocephalic, without obvious abnormality, atraumatic. Eyes: Conjunctivae clear, anicteric sclerae. Pupils are equal ENT Nares normal. No drainage or sinus tenderness. Lips, mucosa, and  tongue normal. No Thrush Neck: Supple, symmetrical, no adenopathy, thyroid: non tender no carotid bruit and no JVD. Back: No CVA tenderness. Lungs: Clear to auscultation bilaterally. No Wheezing or Rhonchi. No rales. Heart: Regular rate and rhythm, no murmur, rub or gallop. Abdomen: Soft, non-tender,not distended. Bowel sounds normal. No masses Extremities: atraumatic, no cyanosis. No edema. No clubbing Skin: No rashes or lesions. Or bruising Lymph: Cervical,  supraclavicular normal. Neurologic: Grossly non-focal Pertinent Labs Lab Results CBC    Component Value Date/Time   WBC 5.0 04/30/2018 0948   WBC 4.7 04/30/2018 0948   RBC 4.54 04/30/2018 0948   RBC 4.49 04/30/2018 0948   HGB 13.9 04/30/2018 0948   HGB 13.8 04/30/2018 0948   HCT 42.7 04/30/2018 0948   HCT 42.3 04/30/2018 0948   PLT 259 04/30/2018 0948   PLT 281 04/30/2018 0948   MCV 94.1 04/30/2018 0948   MCV 94.2 04/30/2018 0948   MCH 30.6 04/30/2018 0948   MCH 30.7 04/30/2018 0948   MCHC 32.6 04/30/2018 0948   MCHC 32.6 04/30/2018 0948   RDW 12.6 04/30/2018 0948   RDW 12.7 04/30/2018 0948   LYMPHSABS 1.7 04/30/2018 0948   MONOABS 0.5 04/30/2018 0948   EOSABS 0.5 04/30/2018 0948   BASOSABS 0.1 04/30/2018 0948    CMP Latest Ref Rng & Units 04/30/2018 08/14/2013 11/24/2011  Glucose 70 - 99 mg/dL 95 73 114(H)  BUN 6 - 20 mg/dL 20 15 14   Creatinine 0.44 - 1.00 mg/dL 0.63 0.70 0.66  Sodium 135 - 145 mmol/L 138 140 137  Potassium 3.5 - 5.1 mmol/L 4.1 4.4 3.7  Chloride 98 - 111 mmol/L 105 101 103  CO2 22 - 32 mmol/L 26 - 25  Calcium 8.9 - 10.3 mg/dL 8.6(L) - 8.9  Total Protein 6.5 - 8.1 g/dL 7.6 - -  Total Bilirubin 0.3 - 1.2 mg/dL 0.5 - -  Alkaline Phos 38 - 126 U/L 95 - -  AST 15 - 41 U/L 25 - -  ALT 0 - 44 U/L 24 - -      Microbiology: No results found for this or any previous visit (from the past 240 hour(s)).  IMAGING RESULTS:  I have personally reviewed the films ? Impression/Recommendation ?53 y.o.  female with a history of asthma Presents with cough and chest pain. Was on a cruise to [place]  and returned on 04/15/18 , ? ?Cough / asthma- likely bronchitis-could be viral Doubt she has  bacterial pneumonia- Normal saturation SAR COV 2 has been checked Low procalcitonin is not seen in bacterial pneumonia Recommend DC antibiotic On discharge she has to go home on face mask and quarantine till results are available. Contact IP before discharge as she will have to sign some forms. Discussed with patient ID will sign off- call f needed ___________________________________________________ Discussed with patient, requesting provider

## 2018-04-30 NOTE — H&P (Signed)
April Reed NAME: April Reed    MR#:  [id number]  DATE OF BIRTH:  [birth year]  DATE OF ADMISSION:  04/30/2018  PRIMARY CARE PHYSICIAN: London Pepper, MD   REQUESTING/REFERRING PHYSICIAN: Eula Listen, MD  CHIEF COMPLAINT:   Chief Complaint  Patient presents with  . Chest Pain    HISTORY OF PRESENT ILLNESS: April Reed  is a 53 y.o. female with a known history of asthma presenting shortness of breath.  Patient states that she returned from a cruise in the [place] on 04/15/2018.  After that her daughter who she traveled with developed fever and cough and her symptoms now completely resolved.  Patient also at that time started developing cough congestion rhinorrhea sore throat.  She was seen by her primary care provider and was treated with Z-Pak.  Continue to have shortness of breath and cough therefore comes to the emergency room.  The emergency room she was noted to have normal WBC count also noticed to have normal lactate level.  However chest x-ray shows bilateral infiltrate.  There is a concern for code with infection.  Also the ED physician states that she feels the patient may have pneumonia that is unresolved.  Patient was ambulated in the emergency room I discussed with the nurse was taking care of the patient stated that her oxygen only dropped to 95 from 100.  To the nurse she was not tachypneic.  However the ED physician stated that patient was tachypneic.       PAST MEDICAL HISTORY:   Past Medical History:  Diagnosis Date  . Acute appendicitis 11/25/2011  . Asthma    no Inhaler use x55yrs    PAST SURGICAL HISTORY:  Past Surgical History:  Procedure Laterality Date  . CESAREAN SECTION  1989  . LAPAROSCOPIC APPENDECTOMY  11/24/2011   Procedure: APPENDECTOMY LAPAROSCOPIC;  Surgeon: Zenovia Jarred, MD;  Location: [facility];  Service: General;  Laterality: N/A;  . LAPAROSCOPIC HYSTERECTOMY  04/13/2011    Procedure: HYSTERECTOMY TOTAL LAPAROSCOPIC;  Surgeon: Terrance Mass, MD;  Location: [facility];  Service: Gynecology;  Laterality: N/A;    SOCIAL HISTORY:  Social History   Tobacco Use  . Smoking status: Former Research scientist (life sciences)  . Smokeless tobacco: Never Used  . Tobacco comment: former passive smoker  Substance Use Topics  . Alcohol use: Yes    Comment: socially    FAMILY HISTORY:  Family History  Problem Relation Age of Onset  . Diabetes Other   . Hypertension Maternal Grandmother   . Breast cancer Maternal Grandmother     DRUG ALLERGIES:  Allergies  Allergen Reactions  . Latex Swelling and Rash    REVIEW OF SYSTEMS:   CONSTITUTIONAL: No fever, fatigue or weakness.  EYES: No blurred or double vision.  EARS, NOSE, AND THROAT: No tinnitus or ear pain.  RESPIRATORY: Positive cough, positive shortness of breath, wheezing or hemoptysis.  CARDIOVASCULAR: No chest pain, orthopnea, edema.  GASTROINTESTINAL: No nausea, vomiting, diarrhea or abdominal pain.  GENITOURINARY: No dysuria, hematuria.  ENDOCRINE: No polyuria, nocturia,  HEMATOLOGY: No anemia, easy bruising or bleeding SKIN: No rash or lesion. MUSCULOSKELETAL: No joint pain or arthritis.   NEUROLOGIC: No tingling, numbness, weakness.  PSYCHIATRY: No anxiety or depression.   MEDICATIONS AT HOME:  Prior to Admission medications   Medication Sig Start Date End Date Taking? Authorizing Provider  albuterol (PROVENTIL HFA;VENTOLIN HFA) 108 (90 BASE) MCG/ACT inhaler Inhale 1-2 puffs into the lungs every 6 (  six) hours as needed for wheezing. 01/30/12  Yes Rosana Hoes, MD  acetaminophen (TYLENOL) 325 MG tablet Take 325 mg by mouth every 6 (six) hours as needed. pain    [provider]  HYDROcodone-acetaminophen (NORCO) 5-325 MG tablet Take 1 tablet every 6 (six) hours as needed by mouth for moderate pain. Patient not taking: Reported on 04/30/2018 12/23/16   Paulette Blanch, MD  ibuprofen (ADVIL,MOTRIN) 800 MG tablet Take 1  tablet (800 mg total) every 8 (eight) hours as needed by mouth for moderate pain. Patient not taking: Reported on 04/30/2018 12/23/16   Paulette Blanch, MD  loratadine (CLARITIN) 10 MG tablet Take 10 mg by mouth daily as needed. For allergies    [provider]  meloxicam (MOBIC) 7.5 MG tablet Take 1 tablet (7.5 mg total) by mouth daily. Patient not taking: Reported on 04/30/2018 01/30/12   Rosana Hoes, MD  ondansetron (ZOFRAN ODT) 4 MG disintegrating tablet Take 1 tablet (4 mg total) every 8 (eight) hours as needed by mouth for nausea or vomiting. Patient not taking: Reported on 04/30/2018 12/23/16   Paulette Blanch, MD  predniSONE (DELTASONE) 20 MG tablet Take 2 tablets (40 mg total) by mouth daily. 2 tablets daily for 5 days Patient not taking: Reported on 04/30/2018 01/30/12   Rosana Hoes, MD      PHYSICAL EXAMINATION:   VITAL SIGNS: Blood pressure (!) 151/87, pulse 75, temperature 97.7 F (36.5 C), temperature source Oral, resp. rate 14, height 5\' 3"  (1.6 m), weight 99.8 kg, last menstrual period 03/28/2011, SpO2 100 %.  GENERAL:  53 y.o.-year-old patient lying in the bed with no acute distress.  EYES: Pupils equal, round, reactive to light and accommodation. No scleral icterus. Extraocular muscles intact.  HEENT: Head atraumatic, normocephalic. Oropharynx and nasopharynx clear.  NECK:  Supple, no jugular venous distention. No thyroid enlargement, no tenderness.  LUNGS: Diminished breath sounds without any accessory muscle usage CARDIOVASCULAR: S1, S2 normal. No murmurs, rubs, or gallops.  ABDOMEN: Soft, nontender, nondistended. Bowel sounds present. No organomegaly or mass.  EXTREMITIES: No pedal edema, cyanosis, or clubbing.  NEUROLOGIC: Cranial nerves II through XII are intact. Muscle strength 5/5 in all extremities. Sensation intact. Gait not checked.  PSYCHIATRIC: The patient is alert and oriented x 3.  SKIN: No obvious rash, lesion, or ulcer.   LABORATORY PANEL:   CBC Recent  Labs  Lab 04/30/18 0948  WBC 4.7  5.0  HGB 13.8  13.9  HCT 42.3  42.7  PLT 281  259  MCV 94.2  94.1  MCH 30.7  30.6  MCHC 32.6  32.6  RDW 12.7  12.6  LYMPHSABS 1.7  MONOABS 0.5  EOSABS 0.5  BASOSABS 0.1   ------------------------------------------------------------------------------------------------------------------  Chemistries  Recent Labs  Lab 04/30/18 0948  NA 138  K 4.1  CL 105  CO2 26  GLUCOSE 95  BUN 20  CREATININE 0.63  CALCIUM 8.6*  AST 25  ALT 24  ALKPHOS 95  BILITOT 0.5   ------------------------------------------------------------------------------------------------------------------ estimated creatinine clearance is 93.8 mL/min (by C-G formula based on SCr of 0.63 mg/dL). ------------------------------------------------------------------------------------------------------------------ No results for input(s): TSH, T4TOTAL, T3FREE, THYROIDAB in the last 72 hours.  Invalid input(s): FREET3   Coagulation profile No results for input(s): INR, PROTIME in the last 168 hours. ------------------------------------------------------------------------------------------------------------------- No results for input(s): DDIMER in the last 72 hours. -------------------------------------------------------------------------------------------------------------------  Cardiac Enzymes Recent Labs  Lab 04/30/18 0948  TROPONINI <0.03   ------------------------------------------------------------------------------------------------------------------ Invalid input(s): POCBNP  ---------------------------------------------------------------------------------------------------------------  Urinalysis  Component Value Date/Time   COLORURINE COLORLESS (A) 04/30/2018 1119   APPEARANCEUR CLEAR (A) 04/30/2018 1119   LABSPEC 1.006 04/30/2018 1119   PHURINE 6.0 04/30/2018 1119   GLUCOSEU NEGATIVE 04/30/2018 1119   HGBUR NEGATIVE 04/30/2018 1119    BILIRUBINUR NEGATIVE 04/30/2018 1119   KETONESUR NEGATIVE 04/30/2018 1119   PROTEINUR NEGATIVE 04/30/2018 1119   UROBILINOGEN 0.2 11/24/2011 1044   NITRITE NEGATIVE 04/30/2018 1119   LEUKOCYTESUR NEGATIVE 04/30/2018 1119     RADIOLOGY: Dg Chest Portable 1 View  Result Date: 04/30/2018 CLINICAL DATA:  Cough and shortness of breath, history asthma EXAM: PORTABLE CHEST 1 VIEW COMPARISON:  Portable exam 0937 hours compared to 01/30/2012 FINDINGS: Upper normal heart size. Mediastinal contours and pulmonary vascularity normal. Patchy opacities in the upper lobes question favor pneumonia on RIGHT and atelectasis versus pneumonia on LEFT. No pleural effusion or pneumothorax. Osseous structures unremarkable. IMPRESSION: Question RIGHT upper lobe pneumonia with atelectasis versus infiltrate additionally seen in LEFT upper lobe. Electronically Signed   By: Lavonia Dana M.D.   On: 04/30/2018 09:58    EKG: Orders placed or performed during the hospital encounter of 04/30/18  . EKG 12-Lead  . EKG 12-Lead  . ED EKG  . ED EKG    IMPRESSION AND PLAN: Patient is 54 year old with history of asthma presenting with shortness of breath  1.  Community-acquired pneumonia / COVID 19 being ruled out We will treat with IV antibiotics I have asked ID doctor to see her Patient likely could be discharged if doing better  2.  Asthma without exasperation use albuterol inhaler as needed  3.  Miscellaneous Lovenox for DVT prophylaxis         All the records are reviewed and case discussed with ED provider. Management plans discussed with the patient, family and they are in agreement.  CODE STATUS:    Code Status Orders  (From admission, onward)         Start     Ordered   04/30/18 1353  Full code  Continuous     04/30/18 1352        Code Status History    This patient has a current code status but no historical code status.       TOTAL TIME TAKING CARE OF THIS PATIENT:55 minutes.     Dustin Flock M.D on 04/30/2018 at 3:04 PM  Between 7am to 6pm - Pager - [phone]  After 6pm go to [email or web address] - password [email or web address] [facility]  [phone]  CC: Primary care physician; London Pepper, MD

## 2018-04-30 NOTE — ED Triage Notes (Signed)
Chest pain x 4 days. Cough noted in triage and has been on cruise to [place]. States chest pain moves in location and has not had injury.

## 2018-04-30 NOTE — ED Notes (Signed)
Ambulated with patient around the room.  Patient's oxygen level dropped down to 95% with ambulation.  Quickly came back up to 100%.

## 2018-04-30 NOTE — ED Notes (Signed)
ED TO INPATIENT HANDOFF REPORT  ED Nurse Name and Phone #: Gertie Exon [phone]  S Name/Age/Gender April Reed 53 y.o. female Room/Bed: ED32A/ED32A  Code Status   Code Status: Not on file  Home/SNF/Other home Patient oriented x 4 Is this baseline?  yes  Triage Complete: Triage complete  Chief Complaint cp  Triage Note Chest pain x 4 days. Cough noted in triage and has been on cruise to [place]. States chest pain moves in location and has not had injury.    Allergies Allergies  Allergen Reactions  . Latex Swelling and Rash    Level of Care/Admitting Diagnosis ED Disposition    ED Disposition Condition [facility]: [facility] [[id number]]  Level of Care: Med-Surg [16]  Diagnosis: PNA (pneumonia) [[id number]]  Admitting Physician: Dustin Flock [[id number]]  Attending Physician: Dustin Flock [[id number]]  Estimated length of stay: past midnight tomorrow  Certification:: I certify this patient will need inpatient services for at least 2 midnights  PT Class (Do Not Modify): Inpatient [101]  PT Acc Code (Do Not Modify): Private [1]       B Medical/Surgery History Past Medical History:  Diagnosis Date  . Acute appendicitis 11/25/2011  . Asthma    no Inhaler use x20yrs   Past Surgical History:  Procedure Laterality Date  . CESAREAN SECTION  1989  . LAPAROSCOPIC APPENDECTOMY  11/24/2011   Procedure: APPENDECTOMY LAPAROSCOPIC;  Surgeon: Zenovia Jarred, MD;  Location: [facility];  Service: General;  Laterality: N/A;  . LAPAROSCOPIC HYSTERECTOMY  04/13/2011   Procedure: HYSTERECTOMY TOTAL LAPAROSCOPIC;  Surgeon: Terrance Mass, MD;  Location: [facility];  Service: Gynecology;  Laterality: N/A;     A IV Location/Drains/Wounds Patient Lines/Drains/Airways Status   Active Line/Drains/Airways    Name:   Placement date:   Placement time:   Site:   Days:   Peripheral IV   -    -    -      Peripheral IV 04/30/18 Left Antecubital   04/30/18     1129    Antecubital   less than 1   Incision 04/13/11 Abdomen   04/13/11    1510     2574   Incision 04/13/11 Perineum   04/13/11    1510     2574   Incision 11/24/11 Abdomen Other (Comment)   11/24/11    1659     2349   Incision - 3 Ports Abdomen 1: Medial;Umbilicus 2: Right;Lateral 3: Left;Lateral   04/13/11    1300     2574          Intake/Output Last 24 hours No intake or output data in the 24 hours ending 04/30/18 1219  Labs/Imaging Results for orders placed or performed during the hospital encounter of 04/30/18 (from the past 48 hour(s))  CBC     Status: None   Collection Time: 04/30/18  9:48 AM  Result Value Ref Range   WBC 5.0 4.0 - 10.5 K/uL   RBC 4.54 3.87 - 5.11 MIL/uL   Hemoglobin 13.9 12.0 - 15.0 g/dL   HCT 42.7 36.0 - 46.0 %   MCV 94.1 80.0 - 100.0 fL   MCH 30.6 26.0 - 34.0 pg   MCHC 32.6 30.0 - 36.0 g/dL   RDW 12.6 11.5 - 15.5 %   Platelets 259 150 - 400 K/uL   nRBC 0.0 0.0 - 0.2 %    Comment: Performed at [facility], [address]  [place], [place] [address]  Troponin I - ONCE - STAT     Status: None   Collection Time: 04/30/18  9:48 AM  Result Value Ref Range   Troponin I <0.03 <0.03 ng/mL    Comment: Performed at [facility], [address] [place], [place] [address]  Comprehensive metabolic panel     Status: Abnormal   Collection Time: 04/30/18  9:48 AM  Result Value Ref Range   Sodium 138 135 - 145 mmol/L   Potassium 4.1 3.5 - 5.1 mmol/L   Chloride 105 98 - 111 mmol/L   CO2 26 22 - 32 mmol/L   Glucose, Bld 95 70 - 99 mg/dL   BUN 20 6 - 20 mg/dL   Creatinine, Ser 0.63 0.44 - 1.00 mg/dL   Calcium 8.6 (L) 8.9 - 10.3 mg/dL   Total Protein 7.6 6.5 - 8.1 g/dL   Albumin 3.8 3.5 - 5.0 g/dL   AST 25 15 - 41 U/L   ALT 24 0 - 44 U/L   Alkaline Phosphatase 95 38 - 126 U/L   Total Bilirubin 0.5 0.3 - 1.2 mg/dL   GFR calc non Af Amer >60 >60 mL/min   GFR calc Af Amer >60 >60 mL/min   Anion gap 7 5 - 15    Comment: Performed at  [facility], [address] [place], [place] [address]  CBC with Differential     Status: None   Collection Time: 04/30/18  9:48 AM  Result Value Ref Range   WBC 4.7 4.0 - 10.5 K/uL   RBC 4.49 3.87 - 5.11 MIL/uL   Hemoglobin 13.8 12.0 - 15.0 g/dL   HCT 42.3 36.0 - 46.0 %   MCV 94.2 80.0 - 100.0 fL   MCH 30.7 26.0 - 34.0 pg   MCHC 32.6 30.0 - 36.0 g/dL   RDW 12.7 11.5 - 15.5 %   Platelets 281 150 - 400 K/uL   nRBC 0.0 0.0 - 0.2 %   Neutrophils Relative % 41 %   Neutro Abs 1.9 1.7 - 7.7 K/uL   Lymphocytes Relative 35 %   Lymphs Abs 1.7 0.7 - 4.0 K/uL   Monocytes Relative 11 %   Monocytes Absolute 0.5 0.1 - 1.0 K/uL   Eosinophils Relative 11 %   Eosinophils Absolute 0.5 0.0 - 0.5 K/uL   Basophils Relative 2 %   Basophils Absolute 0.1 0.0 - 0.1 K/uL   Immature Granulocytes 0 %   Abs Immature Granulocytes 0.00 0.00 - 0.07 K/uL    Comment: Performed at [facility], [address] [place] [address], [place] [address]  Procalcitonin     Status: None   Collection Time: 04/30/18  9:48 AM  Result Value Ref Range   Procalcitonin <0.10 ng/mL    Comment:        Interpretation: PCT (Procalcitonin) <= 0.5 ng/mL: Systemic infection (sepsis) is not likely. Local bacterial infection is possible. (NOTE)       Sepsis PCT Algorithm           Lower Respiratory Tract                                      Infection PCT Algorithm    ----------------------------     ----------------------------         PCT < 0.25 ng/mL  PCT < 0.10 ng/mL         Strongly encourage             Strongly discourage   discontinuation of antibiotics    initiation of antibiotics    ----------------------------     -----------------------------       PCT 0.25 - 0.50 ng/mL            PCT 0.10 - 0.25 ng/mL               OR       >80% decrease in PCT            Discourage initiation of                                            antibiotics      Encourage discontinuation           of  antibiotics    ----------------------------     -----------------------------         PCT >= 0.50 ng/mL              PCT 0.26 - 0.50 ng/mL               AND        <80% decrease in PCT             Encourage initiation of                                             antibiotics       Encourage continuation           of antibiotics    ----------------------------     -----------------------------        PCT >= 0.50 ng/mL                  PCT > 0.50 ng/mL               AND         increase in PCT                  Strongly encourage                                      initiation of antibiotics    Strongly encourage escalation           of antibiotics                                     -----------------------------                                           PCT <= 0.25 ng/mL                                                 OR                                        >  80% decrease in PCT                                     Discontinue / Do not initiate                                             antibiotics Performed at [facility], [address] [place], [place] [address]    Dg Chest Portable 1 View  Result Date: 04/30/2018 CLINICAL DATA:  Cough and shortness of breath, history asthma EXAM: PORTABLE CHEST 1 VIEW COMPARISON:  Portable exam 0937 hours compared to 01/30/2012 FINDINGS: Upper normal heart size. Mediastinal contours and pulmonary vascularity normal. Patchy opacities in the upper lobes question favor pneumonia on RIGHT and atelectasis versus pneumonia on LEFT. No pleural effusion or pneumothorax. Osseous structures unremarkable. IMPRESSION: Question RIGHT upper lobe pneumonia with atelectasis versus infiltrate additionally seen in LEFT upper lobe. Electronically Signed   By: Lavonia Dana M.D.   On: 04/30/2018 09:58    Pending Labs Unresulted Labs (From admission, onward)    Start     Ordered   04/30/18 1132  Novel Coronavirus, NAA (hospital order; send-out to ref lab)   (Novel Coronavirus, NAA [facility] Order; send-out to ref lab) with precautions panel)  Once,   STAT    Question Answer Comment  Current symptoms Other (testing not indicated)   Patient immune status Normal      04/30/18 1131   04/30/18 1043  Influenza panel by PCR (type A & B)  (Influenza PCR Panel)  Once,   STAT     04/30/18 1042   04/30/18 1039  Lactic acid, plasma  Now then every 2 hours,   STAT     04/30/18 1039   04/30/18 1039  Blood Culture (routine x 2)  BLOOD CULTURE X 2,   STAT     04/30/18 1039   04/30/18 1039  Urinalysis, Complete w Microscopic  ONCE - STAT,   STAT     04/30/18 1039   04/30/18 1039  Urine culture  ONCE - STAT,   STAT     04/30/18 1039   Signed and Held  HIV antibody (Routine Testing)  Once,   R     Signed and Held   Signed and Held  CBC  (enoxaparin (LOVENOX)    CrCl >/= 30 ml/min)  Once,   R    Comments:  Baseline for enoxaparin therapy IF NOT ALREADY DRAWN.  Notify MD if PLT < 100 K.    Signed and Held   Signed and Held  Creatinine, serum  (enoxaparin (LOVENOX)    CrCl >/= 30 ml/min)  Once,   R    Comments:  Baseline for enoxaparin therapy IF NOT ALREADY DRAWN.    Signed and Held   Signed and Held  Creatinine, serum  (enoxaparin (LOVENOX)    CrCl >/= 30 ml/min)  Weekly,   R    Comments:  while on enoxaparin therapy    Signed and Held   Signed and Held  Culture, sputum-assessment  Once,   R     Signed and Held          Vitals/Pain Today's Vitals   04/30/18 0858 04/30/18 0900 04/30/18 1151  BP:  119/89 (!) 151/87  Pulse:  83  75  Resp:  18 14  Temp:  97.7 F (36.5 C)   TempSrc:  Oral   SpO2:  100% 100%  Weight: 99.8 kg    Height: 5\' 3"  (1.6 m)    PainSc: 6   5     Isolation Precautions Droplet and Contact precautions  Medications Medications  levofloxacin (LEVAQUIN) IVPB 750 mg (750 mg Intravenous New Bag/Given 04/30/18 1145)  aspirin chewable tablet 324 mg (324 mg Oral Given 04/30/18 0948)  sodium chloride 0.9 % bolus 1,000 mL  (1,000 mLs Intravenous New Bag/Given 04/30/18 1141)    Mobility Low fall risk   Focused Assessments    R

## 2018-04-30 NOTE — ED Notes (Signed)
First Nurse Note: Patient returned from cruise on 04/15/18 from [place].  Here complaining of cough and chest pain.  Given adult mask to wear.

## 2018-05-01 DIAGNOSIS — J189 Pneumonia, unspecified organism: Secondary | ICD-10-CM | POA: Diagnosis not present

## 2018-05-01 DIAGNOSIS — J45909 Unspecified asthma, uncomplicated: Secondary | ICD-10-CM | POA: Diagnosis not present

## 2018-05-01 LAB — LACTIC ACID, PLASMA: LACTIC ACID, VENOUS: 1.1 mmol/L (ref 0.5–1.9)

## 2018-05-01 MED ORDER — LEVOFLOXACIN 500 MG PO TABS: 500.0000 mg | ORAL_TABLET | Freq: Every day | ORAL | 0 refills | Status: AC

## 2018-05-01 NOTE — Discharge Summary (Signed)
[facility] NAME: April Reed    MR#:  [id number]  DATE OF BIRTH:  13-Jan-1966  DATE OF ADMISSION:  04/30/2018 ADMITTING PHYSICIAN: Dustin Flock, MD  DATE OF DISCHARGE: 05/01/2018  PRIMARY CARE PHYSICIAN: London Pepper, MD   ADMISSION DIAGNOSIS:  Pneumonia of both lungs due to infectious organism, unspecified part of lung [J18.9] PNA (pneumonia) [J18.9]  DISCHARGE DIAGNOSIS:  Active Problems:   PNA (pneumonia) Bronchial asthma  SECONDARY DIAGNOSIS:   Past Medical History:  Diagnosis Date  . Acute appendicitis 11/25/2011  . Asthma    no Inhaler use x30yrs     ADMITTING HISTORY April Reed  is a 53 y.o. female with a known history of asthma presenting shortness of breath.  Patient states that she returned from a cruise in the [place] on 04/15/2018.  After that her daughter who she traveled with developed fever and cough and her symptoms now completely resolved.  Patient also at that time started developing cough congestion rhinorrhea sore throat.  She was seen by her primary care provider and was treated with Z-Pak.  Continue to have shortness of breath and cough therefore comes to the emergency room.  The emergency room she was noted to have normal WBC count also noticed to have normal lactate level.  However chest x-ray shows bilateral infiltrate.  There is a concern for code with infection.  Also the ED physician states that she feels the patient may have pneumonia that quadrant time unresolved.  Patient was ambulated in the emergency room I discussed with the nurse was taking care of the patient stated that her oxygen only dropped to 95 from 100.  To the nurse she was not tachypneic.  However the ED physician stated that patient was tachypneic.  HOSPITAL COURSE:  Patient was admitted to medical floor.  She was put on droplet and contact precautions.  SARS coronavirus test was sent out.  Patient was started on Levaquin  antibiotics for pneumonia.  Her shortness of breath improved and cough also improved.  Case was discussed with infectious disease attending.  Patient will be discharged to home on oral Levaquin antibiotic and advised to practice and continue quarantine until the test results for SARS coronavirus comes out.  Flu test negative and blood and urine cultures no growth so far.  CONSULTS OBTAINED:  Treatment Team:  Tsosie Billing, MD  DRUG ALLERGIES:   Allergies  Allergen Reactions  . Latex Swelling and Rash    DISCHARGE MEDICATIONS:   Allergies as of 05/01/2018      Reactions   Latex Swelling, Rash      Medication List    STOP taking these medications   HYDROcodone-acetaminophen 5-325 MG tablet Commonly known as:  Norco   ibuprofen 800 MG tablet Commonly known as:  ADVIL,MOTRIN   meloxicam 7.5 MG tablet Commonly known as:  Mobic   ondansetron 4 MG disintegrating tablet Commonly known as:  Zofran ODT   predniSONE 20 MG tablet Commonly known as:  DELTASONE     TAKE these medications   acetaminophen 325 MG tablet Commonly known as:  TYLENOL Take 325 mg by mouth every 6 (six) hours as needed. pain   albuterol 108 (90 Base) MCG/ACT inhaler Commonly known as:  PROVENTIL HFA;VENTOLIN HFA Inhale 1-2 puffs into the lungs every 6 (six) hours as needed for wheezing.   levofloxacin 500 MG tablet Commonly known as:  Levaquin Take 1 tablet (500 mg total) by mouth daily for 5  days.   loratadine 10 MG tablet Commonly known as:  CLARITIN Take 10 mg by mouth daily as needed. For allergies   Melatonin 10 MG Tabs Take 10 mg by mouth at bedtime as needed (sleep).       Today  Patient seen today No shortness of breath No cough Hemodynamically stable  VITAL SIGNS:  Blood pressure 120/77, pulse 67, temperature 98.5 F (36.9 C), resp. rate 18, height 5\' 3"  (1.6 m), weight 99.8 kg, last menstrual period 03/28/2011, SpO2 100 %.  I/O:    Intake/Output Summary (Last 24  hours) at 05/01/2018 1231 Last data filed at 05/01/2018 0431 Gross per 24 hour  Intake 291.44 ml  Output 550 ml  Net -258.56 ml    PHYSICAL EXAMINATION:  Physical Exam  GENERAL:  53 y.o.-year-old patient lying in the bed with no acute distress.  LUNGS: Normal breath sounds bilaterally, no wheezing, rales,rhonchi or crepitation. No use of accessory muscles of respiration.  CARDIOVASCULAR: S1, S2 normal. No murmurs, rubs, or gallops.  ABDOMEN: Soft, non-tender, non-distended. Bowel sounds present. No organomegaly or mass.  NEUROLOGIC: Moves all 4 extremities. PSYCHIATRIC: The patient is alert and oriented x 3.  SKIN: No obvious rash, lesion, or ulcer.   DATA REVIEW:   CBC Recent Labs  Lab 04/30/18 0948  WBC 4.7  5.0  HGB 13.8  13.9  HCT 42.3  42.7  PLT 281  259    Chemistries  Recent Labs  Lab 04/30/18 0948  NA 138  K 4.1  CL 105  CO2 26  GLUCOSE 95  BUN 20  CREATININE 0.63  CALCIUM 8.6*  AST 25  ALT 24  ALKPHOS 95  BILITOT 0.5    Cardiac Enzymes Recent Labs  Lab 04/30/18 0948  TROPONINI <0.03    Microbiology Results  Results for orders placed or performed during the hospital encounter of 04/30/18  Blood Culture (routine x 2)     Status: None (Preliminary result)   Collection Time: 04/30/18 11:19 AM  Result Value Ref Range Status   Specimen Description BLOOD BLOOD RIGHT WRIST  Final   Special Requests   Final    BOTTLES DRAWN AEROBIC AND ANAEROBIC Blood Culture adequate volume   Culture   Final    NO GROWTH < 24 HOURS Performed at [facility], [address], [place], [place] [address]    Report Status PENDING  Incomplete  Blood Culture (routine x 2)     Status: None (Preliminary result)   Collection Time: 04/30/18 11:20 AM  Result Value Ref Range Status   Specimen Description BLOOD LEFT ANTECUBITAL  Final   Special Requests   Final    BOTTLES DRAWN AEROBIC AND ANAEROBIC Blood Culture adequate volume   Culture   Final    NO GROWTH  < 24 HOURS Performed at [facility], [address] [place], [place] [address]    Report Status PENDING  Incomplete    RADIOLOGY:  Dg Chest Portable 1 View  Result Date: 04/30/2018 CLINICAL DATA:  Cough and shortness of breath, history asthma EXAM: PORTABLE CHEST 1 VIEW COMPARISON:  Portable exam 0937 hours compared to 01/30/2012 FINDINGS: Upper normal heart size. Mediastinal contours and pulmonary vascularity normal. Patchy opacities in the upper lobes question favor pneumonia on RIGHT and atelectasis versus pneumonia on LEFT. No pleural effusion or pneumothorax. Osseous structures unremarkable. IMPRESSION: Question RIGHT upper lobe pneumonia with atelectasis versus infiltrate additionally seen in LEFT upper lobe. Electronically Signed   By: Lavonia Dana  M.D.   On: 04/30/2018 09:58    Follow up with PCP in 1 week.  Management plans discussed with the patient, family and they are in agreement.  CODE STATUS: Full code    Code Status Orders  (From admission, onward)         Start     Ordered   04/30/18 1353  Full code  Continuous     04/30/18 1352        Code Status History    This patient has a current code status but no historical code status.      TOTAL TIME TAKING CARE OF THIS PATIENT ON DAY OF DISCHARGE: more than 35 minutes.   Saundra Shelling M.D on 05/01/2018 at 12:31 PM  Between 7am to 6pm - Pager - [phone]  After 6pm go to [email or web address] - password [email or web address] [facility]  Office  [phone]  CC: Primary care physician; London Pepper, MD  Note: This dictation was prepared with Dragon dictation along with smaller phrase technology. Any transcriptional errors that result from this process are unintentional.

## 2018-05-01 NOTE — Progress Notes (Signed)
Discharge instructions explained to pt/ verbalized an understanding/ iv and tele removed/ COVID 19 discharge instructions explained to pt/ transported off unit via wheelchair with surgical mask.

## 2018-05-01 NOTE — Plan of Care (Signed)
  Problem: Education: Goal: Knowledge of General Education information will improve Description Including pain rating scale, medication(s)/side effects and non-pharmacologic comfort measures Outcome: Progressing   Problem: Clinical Measurements: Goal: Will remain free from infection Outcome: Progressing   Problem: Nutrition: Goal: Adequate nutrition will be maintained Outcome: Progressing   Problem: Coping: Goal: Level of anxiety will decrease Outcome: Progressing   

## 2018-05-01 NOTE — Progress Notes (Signed)
Patient called and given follow-up appointment information of March 30 at 2:30pm with Dr. Rolland Bimler. Pt verbalized understanding.

## 2018-05-02 LAB — URINE CULTURE: Culture: NO GROWTH

## 2018-05-02 LAB — HIV ANTIBODY (ROUTINE TESTING W REFLEX): HIV Screen 4th Generation wRfx: NONREACTIVE

## 2018-05-05 LAB — CULTURE, BLOOD (ROUTINE X 2)
Culture: NO GROWTH
Culture: NO GROWTH
SPECIAL REQUESTS: ADEQUATE
Special Requests: ADEQUATE

## 2018-05-07 DIAGNOSIS — J45901 Unspecified asthma with (acute) exacerbation: Secondary | ICD-10-CM | POA: Diagnosis not present

## 2018-05-07 DIAGNOSIS — R52 Pain, unspecified: Secondary | ICD-10-CM | POA: Diagnosis not present

## 2018-05-07 DIAGNOSIS — J189 Pneumonia, unspecified organism: Secondary | ICD-10-CM | POA: Diagnosis not present

## 2018-05-08 LAB — NOVEL CORONAVIRUS, NAA (HOSP ORDER, SEND-OUT TO REF LAB; TAT 18-24 HRS): SARS-CoV-2, NAA: NOT DETECTED

## 2018-05-09 ENCOUNTER — Telehealth: Payer: Self-pay | Admitting: Emergency Medicine

## 2018-05-09 NOTE — Telephone Encounter (Signed)
Patient called for covid 19 result.  Gave her result.

## 2018-05-29 ENCOUNTER — Other Ambulatory Visit: Payer: Self-pay | Admitting: Family Medicine

## 2018-05-29 ENCOUNTER — Ambulatory Visit
Admission: RE | Admit: 2018-05-29 | Discharge: 2018-05-29 | Disposition: A | Discharge status: Home / Self Care | Payer: 59 | Source: Ambulatory Visit | Attending: Family Medicine | Admitting: Family Medicine

## 2018-05-29 ENCOUNTER — Other Ambulatory Visit: Payer: Self-pay

## 2018-05-29 DIAGNOSIS — R05 Cough: Secondary | ICD-10-CM

## 2018-05-29 DIAGNOSIS — R059 Cough, unspecified: Secondary | ICD-10-CM

## 2018-07-05 ENCOUNTER — Other Ambulatory Visit: Payer: Self-pay | Admitting: Family Medicine

## 2018-07-05 ENCOUNTER — Other Ambulatory Visit: Payer: Self-pay

## 2018-07-05 ENCOUNTER — Ambulatory Visit
Admission: RE | Admit: 2018-07-05 | Discharge: 2018-07-05 | Disposition: A | Discharge status: Home / Self Care | Payer: 59 | Source: Ambulatory Visit | Attending: Family Medicine | Admitting: Family Medicine

## 2018-07-05 DIAGNOSIS — Z01818 Encounter for other preprocedural examination: Secondary | ICD-10-CM

## 2018-09-19 ENCOUNTER — Other Ambulatory Visit: Payer: Self-pay | Admitting: Family Medicine

## 2018-09-19 DIAGNOSIS — Z1231 Encounter for screening mammogram for malignant neoplasm of breast: Secondary | ICD-10-CM

## 2018-10-30 ENCOUNTER — Other Ambulatory Visit: Payer: Self-pay | Admitting: Family Medicine

## 2018-10-30 ENCOUNTER — Ambulatory Visit
Admission: RE | Admit: 2018-10-30 | Discharge: 2018-10-30 | Disposition: A | Discharge status: Home / Self Care | Payer: 59 | Source: Ambulatory Visit | Attending: Family Medicine | Admitting: Family Medicine

## 2018-10-30 ENCOUNTER — Other Ambulatory Visit: Payer: Self-pay

## 2018-10-30 DIAGNOSIS — R059 Cough, unspecified: Secondary | ICD-10-CM

## 2018-10-30 DIAGNOSIS — R062 Wheezing: Secondary | ICD-10-CM

## 2018-10-30 DIAGNOSIS — R05 Cough: Secondary | ICD-10-CM

## 2018-11-06 ENCOUNTER — Ambulatory Visit: Payer: 59

## 2018-12-21 ENCOUNTER — Ambulatory Visit
Admission: RE | Admit: 2018-12-21 | Discharge: 2018-12-21 | Disposition: A | Discharge status: Home / Self Care | Payer: 59 | Source: Ambulatory Visit | Attending: Family Medicine | Admitting: Family Medicine

## 2018-12-21 ENCOUNTER — Other Ambulatory Visit: Payer: Self-pay

## 2018-12-21 DIAGNOSIS — Z1231 Encounter for screening mammogram for malignant neoplasm of breast: Secondary | ICD-10-CM

## 2019-07-23 ENCOUNTER — Other Ambulatory Visit: Payer: Self-pay | Admitting: Orthopedic Surgery

## 2019-07-23 DIAGNOSIS — M25562 Pain in left knee: Secondary | ICD-10-CM

## 2019-08-27 ENCOUNTER — Ambulatory Visit
Admission: RE | Admit: 2019-08-27 | Discharge: 2019-08-27 | Disposition: A | Discharge status: Home / Self Care | Payer: BC Managed Care – PPO | Source: Ambulatory Visit | Attending: Orthopedic Surgery | Admitting: Orthopedic Surgery

## 2019-08-27 DIAGNOSIS — M25562 Pain in left knee: Secondary | ICD-10-CM

## 2020-06-10 ENCOUNTER — Other Ambulatory Visit: Payer: Self-pay | Admitting: Family Medicine

## 2020-06-10 DIAGNOSIS — Z1231 Encounter for screening mammogram for malignant neoplasm of breast: Secondary | ICD-10-CM

## 2020-06-15 ENCOUNTER — Ambulatory Visit: Payer: BC Managed Care – PPO | Admitting: Neurology

## 2020-06-15 ENCOUNTER — Encounter: Payer: Self-pay | Admitting: Neurology

## 2020-06-15 ENCOUNTER — Other Ambulatory Visit: Payer: Self-pay

## 2020-06-15 VITALS — BP 116/74 | HR 68 | Ht 63.0 in | Wt 218.0 lb

## 2020-06-15 DIAGNOSIS — R700 Elevated erythrocyte sedimentation rate: Secondary | ICD-10-CM | POA: Diagnosis not present

## 2020-06-15 DIAGNOSIS — R252 Cramp and spasm: Secondary | ICD-10-CM

## 2020-06-15 DIAGNOSIS — R748 Abnormal levels of other serum enzymes: Secondary | ICD-10-CM | POA: Insufficient documentation

## 2020-06-15 NOTE — Progress Notes (Signed)
[facility]    Provider:  Dr Jaynee Eagles Requesting Provider: Lahoma Rocker, MD Primary Care Provider:  London Pepper, MD  CC:  Evaluation for emg/ncs for muscle disease  HPI:  April Reed is a 55 y.o. female here as requested by Lahoma Rocker, MD for evaluation of emg for myositis or other muscle disease. Symptoms on and off since the summer, she started having cramping in the hands and feet. Dr. Orland Mustard gave her muscle relaxers but they made her groggy. She stopped certain foods and caffeine. Random, unknown triggers, she is eating better, last week in no particular setting her toes started curling during the day and has happened randomly at night, unclear if weather affects her. Lasts a few minutes 5-7 minutes, it hurts, Worse in her feet. She has only had it once in her hands in the left hand severe, warm water helps. Mostly in the feet. Caffeine can trigger actually. She denies any significant low back pain or neck pain. Symptoms are not that often, a few times a month. No numbness or tingling or weakness. She has knee pain, but no falls, no facial weakness, no weakness in limbs, no problems walking, no swallowing problems, no vision changes.  She does take meloxicam and ibuprofen which may help a little bit, sometimes taking a low-dose of potassium actually makes it worse.No other focal neurologic deficits, associated symptoms, inciting events or modifiable factors.  Reviewed notes, labs and imaging from outside physicians, which showed:   I reviewed Dr. Desma Mcgregor notes, she is a 55 year old female for evaluation of elevated sed rate, arthralgias, muscle cramps, patient reported muscle cramps in her hands and legs for the last year, intermittent, she takes over-the-counter ibuprofen for the relief, she is also tried muscle relaxer in the past, intermittent back pain knee pain, her pain is worse with activities, she is actually followed by orthopedic and states that she was  told to have arthritis, shakes meloxicam every day for that, she denies any red hot swollen joints, she denies any significant pain in the hands or morning stiffness, no family history of RA lupus or psoriasis.  I reviewed labs, TSH 1.14, ferritin 154, CRP 6, RA less than 10, sed rate 50, vitamin D 25-hydroxy 74, CCP 7, ANA negative, HLA-B27 negative, double-stranded DNA within normal limits, RNP antibodies within normal limits, Smith antibodies within normal limits, negative scleroderma antibodies within normal limits, Sjogren's antibodies within normal limits, anti-Jo and anticentromere negative, CK was slightly elevated at 345, magnesium normal.  Review of Systems: Patient complains of symptoms per HPI as well as the following symptoms: allergies. Pertinent negatives and positives per HPI. All others negative.   Social History   Socioeconomic History  . Marital status: Single    Spouse name: Not on file  . Number of children: 2  . Years of education: Not on file  . Highest education level: Master's degree (e.g., MA, MS, MEng, MEd, MSW, MBA)  Occupational History  . Not on file  Tobacco Use  . Smoking status: Former Smoker    Quit date: 06/16/2007    Years since quitting: 13.0  . Smokeless tobacco: Never Used  . Tobacco comment: former passive smoker  Substance and Sexual Activity  . Alcohol use: Yes    Comment: socially  . Drug use: No  . Sexual activity: Yes    Birth control/protection: None  Other Topics Concern  . Not on file  Social History Narrative   Lives alone   No caffeine  Social Determinants of Health   Financial Resource Strain: Not on file  Food Insecurity: Not on file  Transportation Needs: Not on file  Physical Activity: Not on file  Stress: Not on file  Social Connections: Not on file  Intimate Partner Violence: Not on file    Family History  Problem Relation Age of Onset  . Diabetes Other   . Hypertension Maternal Grandmother   . Breast cancer  Maternal Grandmother   . Other Mother        AIDS  . Neuromuscular disorder Neg Hx     Past Medical History:  Diagnosis Date  . Acute appendicitis 11/25/2011  . Asthma    no Inhaler use x61yr    Patient Active Problem List   Diagnosis Date Noted  . Muscle cramp 06/15/2020  . Elevated sed rate 06/15/2020  . Elevated CK 06/15/2020  . PNA (pneumonia) 04/30/2018  . Acute appendicitis 11/25/2011    Past Surgical History:  Procedure Laterality Date  . CESAREAN SECTION  1989  . LAPAROSCOPIC APPENDECTOMY  11/24/2011   Procedure: APPENDECTOMY LAPAROSCOPIC;  Surgeon: BZenovia Jarred MD;  [facility]  Service: General;  Laterality: N/A;  . LAPAROSCOPIC HYSTERECTOMY  04/13/2011   Procedure: HYSTERECTOMY TOTAL LAPAROSCOPIC;  Surgeon: JTerrance Mass MD;  [facility];  Service: Gynecology;  Laterality: N/A;  . MENISCUS REPAIR Bilateral 07/2018, 09/2019  . ROTATOR CUFF REPAIR  05/2017    Current Outpatient Medications  Medication Sig Dispense Refill  . acetaminophen (TYLENOL) 325 MG tablet Take 325 mg by mouth every 6 (six) hours as needed. pain    . albuterol (PROVENTIL HFA;VENTOLIN HFA) 108 (90 BASE) MCG/ACT inhaler Inhale 1-2 puffs into the lungs every 6 (six) hours as needed for wheezing. 1 Inhaler 0  . loratadine (CLARITIN) 10 MG tablet Take 10 mg by mouth daily as needed. For allergies    . Melatonin 10 MG TABS Take 10 mg by mouth at bedtime as needed (sleep).     No current facility-administered medications for this visit.    Allergies as of 06/15/2020 - Review Complete 06/15/2020  Allergen Reaction Noted  . Latex Swelling and Rash     Vitals: BP 116/74   Pulse 68   Ht 5' 3"  (1.6 m)   Wt 218 lb (98.9 kg)   LMP 03/28/2011   BMI 38.62 kg/m  Last Weight:  Wt Readings from Last 1 Encounters:  06/15/20 218 lb (98.9 kg)   Last Height:   Ht Readings from Last 1 Encounters:  06/15/20 5' 3"  (1.6 m)     Physical exam: Exam: Gen: NAD, conversant, well  nourised, obese, well groomed                     CV: RRR, no MRG. No Carotid Bruits. No peripheral edema, warm, nontender Eyes: Conjunctivae clear without exudates or hemorrhage  Neuro: Detailed Neurologic Exam  Speech:    Speech is normal; fluent and spontaneous with normal comprehension.  Cognition:    The patient is oriented to person, place, and time;     recent and remote memory intact;     language fluent;     normal attention, concentration,     fund of knowledge Cranial Nerves:    The pupils are equal, round, and reactive to light. The fundi areflat.  Visual fields are full to finger confrontation. Extraocular movements are intact. Trigeminal sensation is intact and the muscles of mastication are normal. The face is symmetric.  The palate elevates in the midline. Hearing intact. Voice is normal. Shoulder shrug is normal. The tongue has normal motion without fasciculations.   Coordination:    No dusmetria or ataxia  Gait:    Antalgic (knee pain)  Motor Observation:    No asymmetry, no atrophy, and no involuntary movements noted. Tone:    Normal muscle tone.    Posture:    Posture is normal. normal erect    Strength:    Strength is V/V in the upper and lower limbs.      Sensation: intact to LT     Reflex Exam:  DTR's:    Deep tendon reflexes in the upper and lower extremities are hyporeflexic but symmetrical bilaterally.   Toes:    The toes are downgoing bilaterally.   Clonus:    Clonus is absent.    Assessment/Plan: This is a 55 year old patient here for evaluation from rheumatology for EMG nerve conduction study in the setting of muscle cramps, slightly elevated CK (345) and elevated sed rate (50).  EMG/NCS left arm and left leg  Orders Placed This Encounter  Procedures  . NCV with EMG(electromyography)   No orders of the defined types were placed in this encounter.   Cc: Lahoma Rocker, MD,  London Pepper, MD  Sarina Ill, MD  [facility] [address] [place], [place] [address]  Phone [phone] Fax [phone]

## 2020-06-15 NOTE — Patient Instructions (Signed)
EMG/NCS   Electromyoneurogram Electromyoneurogram is a test to check how well your muscles and nerves are working. This procedure includes the combined use of electromyogram (EMG) and nerve conduction study (NCS). EMG is used to look for muscular disorders. NCS, which is also called electroneurogram, measures how well your nerves are controlling your muscles. The procedures are usually done together to check if your muscles and nerves are healthy. If the results of the tests are abnormal, this may indicate disease or injury, such as a neuromuscular disease or peripheral nerve damage. Tell a health care provider about:  Any allergies you have.  All medicines you are taking, including vitamins, herbs, eye drops, creams, and over-the-counter medicines.  Any problems you or family members have had with anesthetic medicines.  Any blood disorders you have.  Any surgeries you have had.  Any medical conditions you have.  If you have a pacemaker.  Whether you are pregnant or may be pregnant. What are the risks? Generally, this is a safe procedure. However, problems may occur, including:  Infection where the electrodes were inserted.  Bleeding. What happens before the procedure? Medicines Ask your health care provider about:  Changing or stopping your regular medicines. This is especially important if you are taking diabetes medicines or blood thinners.  Taking medicines such as aspirin and ibuprofen. These medicines can thin your blood. Do not take these medicines unless your health care provider tells you to take them.  Taking over-the-counter medicines, vitamins, herbs, and supplements. General instructions  Your health care provider may ask you to avoid: ? Beverages that have caffeine, such as coffee and tea. ? Any products that contain nicotine or tobacco. These products include cigarettes, e-cigarettes, and chewing tobacco. If you need help quitting, ask your health care  provider.  Do not use lotions or creams on the same day that you will be having the procedure. What happens during the procedure? For EMG  Your health care provider will ask you to stay in a position so that he or she can access the muscle that will be studied. You may be standing, sitting, or lying down.  You may be given a medicine that numbs the area (local anesthetic).  A very thin needle that has an electrode will be inserted into your muscle.  Another small electrode will be placed on your skin near the muscle.  Your health care provider will ask you to continue to remain still.  The electrodes will send a signal that tells about the electrical activity of your muscles. You may see this on a monitor or hear it in the room.  After your muscles have been studied at rest, your health care provider will ask you to contract or flex your muscles. The electrodes will send a signal that tells about the electrical activity of your muscles.  Your health care provider will remove the electrodes and the electrode needles when the procedure is finished. The procedure may vary among health care providers and hospitals.   For NCS  An electrode that records your nerve activity (recording electrode) will be placed on your skin by the muscle that is being studied.  An electrode that is used as a reference (reference electrode) will be placed near the recording electrode.  A paste or gel will be applied to your skin between the recording electrode and the reference electrode.  Your nerve will be stimulated with a mild shock. Your health care provider will measure how much time it takes for your   muscle to react.  Your health care provider will remove the electrodes and the gel when the procedure is finished. The procedure may vary among health care providers and hospitals.   What happens after the procedure?  It is up to you to get the results of your procedure. Ask your health care provider,  or the department that is doing the procedure, when your results will be ready.  Your health care provider may: ? Give you medicines for any pain. ? Monitor the insertion sites to make sure that bleeding stops. Summary  Electromyoneurogram is a test to check how well your muscles and nerves are working.  If the results of the tests are abnormal, this may indicate disease or injury.  This is a safe procedure. However, problems may occur, such as bleeding and infection.  Your health care provider will do two tests to complete this procedure. One checks your muscles (EMG) and another checks your nerves (NCS).  It is up to you to get the results of your procedure. Ask your health care provider, or the department that is doing the procedure, when your results will be ready. This information is not intended to replace advice given to you by your health care provider. Make sure you discuss any questions you have with your health care provider. Document Revised: 10/10/2017 Document Reviewed: 09/22/2017 Elsevier Patient Education  2021 [employer]vier Inc.  

## 2020-07-30 ENCOUNTER — Other Ambulatory Visit: Payer: Self-pay

## 2020-07-30 ENCOUNTER — Ambulatory Visit
Admission: RE | Admit: 2020-07-30 | Discharge: 2020-07-30 | Disposition: A | Discharge status: Home / Self Care | Payer: BC Managed Care – PPO | Source: Ambulatory Visit | Attending: Family Medicine | Admitting: Family Medicine

## 2020-07-30 VITALS — BP 133/92 | HR 86 | Temp 98.2°F | Resp 17 | Ht 63.0 in | Wt 211.0 lb

## 2020-07-30 DIAGNOSIS — J069 Acute upper respiratory infection, unspecified: Secondary | ICD-10-CM

## 2020-07-30 MED ORDER — IPRATROPIUM BROMIDE 0.06 % NA SOLN: 2.0000 | Freq: Four times a day (QID) | NASAL | 0 refills | Status: AC | PRN

## 2020-07-30 MED ORDER — PROMETHAZINE-DM 6.25-15 MG/5ML PO SYRP: 5.0000 mL | ORAL_SOLUTION | Freq: Four times a day (QID) | ORAL | 0 refills | Status: AC | PRN

## 2020-07-30 NOTE — Discharge Instructions (Addendum)
Rest. Fluids.  Medication as directed.  Follow up with your PCP.  If you worsen, please let us know or go to the ER.

## 2020-07-30 NOTE — ED Triage Notes (Signed)
Patient states that she has been having a cough, sore throat, hoarseness and shortness of breath since yesterday. States that she did a home covid test and was negative.

## 2020-07-30 NOTE — ED Provider Notes (Signed)
[facility]    CSN: [id number] Arrival date & time: 07/30/20  1152      History   Chief Complaint Chief Complaint  Patient presents with   Cough   HPI  55 year old female presents with respiratory symptoms.  Started yesterday.  Reports cough, sore throat, hoarseness, sneezing, runny nose.  No documented fever.  No reported sick contacts.  She has taken a home COVID test which has been negative.  She is concerned that she has an upper respiratory infection.  She has used albuterol with some improvement.  No other associated symptoms.  No other complaints.   Past Medical History:  Diagnosis Date   Acute appendicitis 11/25/2011   Asthma    no Inhaler use x19yrs    Patient Active Problem List   Diagnosis Date Noted   Muscle cramp 06/15/2020   Elevated sed rate 06/15/2020   Elevated CK 06/15/2020   PNA (pneumonia) 04/30/2018   Acute appendicitis 11/25/2011    Past Surgical History:  Procedure Laterality Date   CESAREAN SECTION  1989   LAPAROSCOPIC APPENDECTOMY  11/24/2011   Procedure: APPENDECTOMY LAPAROSCOPIC;  Surgeon: Zenovia Jarred, MD;  Location: [facility];  Service: General;  Laterality: N/A;   LAPAROSCOPIC HYSTERECTOMY  04/13/2011   Procedure: HYSTERECTOMY TOTAL LAPAROSCOPIC;  Surgeon: Terrance Mass, MD;  Location: [facility];  Service: Gynecology;  Laterality: N/A;   MENISCUS REPAIR Bilateral 07/2018, 09/2019   ROTATOR CUFF REPAIR  05/2017    OB History     Gravida  2   Para  1   Term      Preterm      AB  1   Living  2      SAB  1   IAB      Ectopic      Multiple      Live Births               Home Medications    Prior to Admission medications   Medication Sig Start Date End Date Taking? Authorizing Provider  acetaminophen (TYLENOL) 325 MG tablet Take 325 mg by mouth every 6 (six) hours as needed. pain   Yes [provider]  albuterol (PROVENTIL HFA;VENTOLIN HFA) 108 (90 BASE) MCG/ACT inhaler Inhale 1-2 puffs into  the lungs every 6 (six) hours as needed for wheezing. 01/30/12  Yes Rosana Hoes, MD  ipratropium (ATROVENT) 0.06 % nasal spray Place 2 sprays into both nostrils 4 (four) times daily as needed for rhinitis. 07/30/20  Yes Arbor Cohen G, DO  loratadine (CLARITIN) 10 MG tablet Take 10 mg by mouth daily as needed. For allergies   Yes [provider]  promethazine-dextromethorphan (PROMETHAZINE-DM) 6.25-15 MG/5ML syrup Take 5 mLs by mouth 4 (four) times daily as needed for cough. 07/30/20  Yes Coral Spikes, DO    Family History Family History  Problem Relation Age of Onset   Diabetes Other    Hypertension Maternal Grandmother    Breast cancer Maternal Grandmother    Other Mother        AIDS   Neuromuscular disorder Neg Hx     Social History Social History   Tobacco Use   Smoking status: Former    Pack years: 0.00    Types: Cigarettes    Quit date: 06/16/2007    Years since quitting: 13.1   Smokeless tobacco: Never   Tobacco comments:    former passive smoker  Substance Use Topics   Alcohol use: Yes  Comment: socially   Drug use: No     Allergies   Latex   Review of Systems Review of Systems Per HPI  Physical Exam Triage Vital Signs ED Triage Vitals  Enc Vitals Group     BP 07/30/20 1212 (!) 133/92     Pulse Rate 07/30/20 1212 86     Resp 07/30/20 1212 17     Temp 07/30/20 1212 98.2 F (36.8 C)     Temp Source 07/30/20 1212 Oral     SpO2 07/30/20 1212 99 %     Weight 07/30/20 1211 211 lb (95.7 kg)     Height 07/30/20 1211 5\' 3"  (1.6 m)     Head Circumference --      Peak Flow --      Pain Score 07/30/20 1211 2     Pain Loc --      Pain Edu? --      Excl. in [place]? --    Updated Vital Signs BP (!) 133/92 (BP Location: Left Arm)   Pulse 86   Temp 98.2 F (36.8 C) (Oral)   Resp 17   Ht 5\' 3"  (1.6 m)   Wt 95.7 kg   LMP 03/28/2011   SpO2 99%   BMI 37.38 kg/m   Visual Acuity Right Eye Distance:   Left Eye Distance:   Bilateral Distance:     Right Eye Near:   Left Eye Near:    Bilateral Near:     Physical Exam Vitals and nursing note reviewed.  Constitutional:      General: She is not in acute distress.    Appearance: Normal appearance. She is not ill-appearing.  HENT:     Head: Normocephalic and atraumatic.     Right Ear: Tympanic membrane normal.     Left Ear: Tympanic membrane normal.     Mouth/Throat:     Pharynx: Oropharynx is clear. No oropharyngeal exudate or posterior oropharyngeal erythema.  Cardiovascular:     Rate and Rhythm: Normal rate and regular rhythm.  Pulmonary:     Effort: Pulmonary effort is normal.     Breath sounds: Normal breath sounds. No wheezing, rhonchi or rales.  Neurological:     Mental Status: She is alert.  Psychiatric:        Mood and Affect: Mood normal.        Behavior: Behavior normal.     UC Treatments / Results  Labs (all labs ordered are listed, but only abnormal results are displayed) Labs Reviewed - No data to display  EKG   Radiology No results found.  Procedures Procedures (including critical care time)  Medications Ordered in UC Medications - No data to display  Initial Impression / Assessment and Plan / UC Course  I have reviewed the triage vital signs and the nursing notes.  Pertinent labs & imaging results that were available during my care of the patient were reviewed by me and considered in my medical decision making (see chart for details).    55 year old female presents with a viral URI. Treating with Atrovent nasal spray and Promethazine DM.  Final Clinical Impressions(s) / UC Diagnoses   Final diagnoses:  Viral URI with cough     Discharge Instructions      Rest. Fluids.  Medication as directed.  Follow up with your PCP.  If you worsen, please let us know or go to the ER.   ED Prescriptions     Medication Sig Dispense Auth. Provider  ipratropium (ATROVENT) 0.06 % nasal spray Place 2 sprays into both nostrils 4 (four) times  daily as needed for rhinitis. 15 mL Amisha Pospisil G, DO   promethazine-dextromethorphan (PROMETHAZINE-DM) 6.25-15 MG/5ML syrup Take 5 mLs by mouth 4 (four) times daily as needed for cough. 118 mL Coral Spikes, DO      PDMP not reviewed this encounter.   Coral Spikes, DO 07/30/20 1344

## 2020-08-06 ENCOUNTER — Ambulatory Visit
Admission: RE | Admit: 2020-08-06 | Discharge: 2020-08-06 | Disposition: A | Discharge status: Home / Self Care | Payer: BC Managed Care – PPO | Source: Ambulatory Visit | Attending: Family Medicine | Admitting: Family Medicine

## 2020-08-06 ENCOUNTER — Other Ambulatory Visit: Payer: Self-pay

## 2020-08-06 DIAGNOSIS — Z1231 Encounter for screening mammogram for malignant neoplasm of breast: Secondary | ICD-10-CM

## 2020-08-17 ENCOUNTER — Encounter: Payer: Self-pay | Admitting: Neurology

## 2020-08-17 ENCOUNTER — Telehealth: Payer: Self-pay | Admitting: Neurology

## 2020-08-17 ENCOUNTER — Ambulatory Visit (INDEPENDENT_AMBULATORY_CARE_PROVIDER_SITE_OTHER): Payer: BC Managed Care – PPO | Admitting: Neurology

## 2020-08-17 DIAGNOSIS — Z5329 Procedure and treatment not carried out because of patient's decision for other reasons: Secondary | ICD-10-CM

## 2020-08-17 NOTE — Progress Notes (Signed)
Patient no-show to appointments today at [facility] 1 for nerve conduction studies and the second for electromyogram neurogram.

## 2020-08-17 NOTE — Telephone Encounter (Signed)
Ms. Wolfson no-show to neurology appointments today, 1 for nerve conduction studies and 1 for electromyogram.  If she calls back to reschedule please discuss with Dr. Lavell Anchors first.

## 2020-08-19 ENCOUNTER — Other Ambulatory Visit: Payer: Self-pay | Admitting: Family Medicine

## 2021-05-26 ENCOUNTER — Encounter (HOSPITAL_COMMUNITY): Payer: Self-pay

## 2021-05-26 ENCOUNTER — Other Ambulatory Visit: Payer: Self-pay

## 2021-05-26 ENCOUNTER — Emergency Department (HOSPITAL_COMMUNITY)
Admission: EM | Admit: 2021-05-26 | Discharge: 2021-05-26 | Disposition: A | Discharge status: Home / Self Care | Payer: BC Managed Care – PPO | Attending: Emergency Medicine | Admitting: Emergency Medicine

## 2021-05-26 ENCOUNTER — Emergency Department (HOSPITAL_COMMUNITY): Payer: BC Managed Care – PPO

## 2021-05-26 DIAGNOSIS — Z20822 Contact with and (suspected) exposure to covid-19: Secondary | ICD-10-CM | POA: Insufficient documentation

## 2021-05-26 DIAGNOSIS — R0602 Shortness of breath: Secondary | ICD-10-CM

## 2021-05-26 DIAGNOSIS — J181 Lobar pneumonia, unspecified organism: Secondary | ICD-10-CM | POA: Insufficient documentation

## 2021-05-26 DIAGNOSIS — Z91040 Latex allergy status: Secondary | ICD-10-CM | POA: Insufficient documentation

## 2021-05-26 DIAGNOSIS — J189 Pneumonia, unspecified organism: Secondary | ICD-10-CM

## 2021-05-26 DIAGNOSIS — J45909 Unspecified asthma, uncomplicated: Secondary | ICD-10-CM | POA: Insufficient documentation

## 2021-05-26 LAB — CBC
HCT: 41.7 % (ref 36.0–46.0)
Hemoglobin: 13.7 g/dL (ref 12.0–15.0)
MCH: 30.6 pg (ref 26.0–34.0)
MCHC: 32.9 g/dL (ref 30.0–36.0)
MCV: 93.3 fL (ref 80.0–100.0)
Platelets: 256 10*3/uL (ref 150–400)
RBC: 4.47 MIL/uL (ref 3.87–5.11)
RDW: 12.6 % (ref 11.5–15.5)
WBC: 6 10*3/uL (ref 4.0–10.5)
nRBC: 0 % (ref 0.0–0.2)

## 2021-05-26 LAB — TROPONIN I (HIGH SENSITIVITY)
Troponin I (High Sensitivity): 4 ng/L (ref <18)
Troponin I (High Sensitivity): 3 ng/L (ref <18)

## 2021-05-26 LAB — BASIC METABOLIC PANEL
Anion gap: 6 (ref 5–15)
BUN: 17 mg/dL (ref 6–20)
CO2: 24 mmol/L (ref 22–32)
Calcium: 8.5 mg/dL — ABNORMAL LOW (ref 8.9–10.3)
Chloride: 105 mmol/L (ref 98–111)
Creatinine, Ser: 1.01 mg/dL — ABNORMAL HIGH (ref 0.44–1.00)
GFR, Estimated: >60 mL/min (ref >60)
Glucose, Bld: 95 mg/dL (ref 70–99)
Potassium: 4.1 mmol/L (ref 3.5–5.1)
Sodium: 135 mmol/L (ref 135–145)

## 2021-05-26 LAB — RESP PANEL BY RT-PCR (FLU A&B, COVID) ARPGX2
Influenza A by PCR: NEGATIVE
Influenza B by PCR: NEGATIVE
SARS Coronavirus 2 by RT PCR: NEGATIVE

## 2021-05-26 MED ORDER — ALBUTEROL SULFATE (2.5 MG/3ML) 0.083% IN NEBU
5.0000 mg | INHALATION_SOLUTION | Freq: Once | RESPIRATORY_TRACT | Status: AC
  Administered 2021-05-26: 5 mg via RESPIRATORY_TRACT
  Filled 2021-05-26: qty 6

## 2021-05-26 MED ORDER — DOXYCYCLINE HYCLATE 100 MG PO CAPS: 100.0000 mg | ORAL_CAPSULE | Freq: Two times a day (BID) | ORAL | 0 refills | Status: AC

## 2021-05-26 MED ORDER — ALBUTEROL SULFATE HFA 108 (90 BASE) MCG/ACT IN AERS
1.0000 | INHALATION_SPRAY | Freq: Four times a day (QID) | RESPIRATORY_TRACT | 0 refills | Status: AC | PRN
Start: 2021-05-26 — End: 2021-06-25

## 2021-05-26 MED ORDER — ALBUTEROL SULFATE HFA 108 (90 BASE) MCG/ACT IN AERS
2.0000 | INHALATION_SPRAY | RESPIRATORY_TRACT | Status: DC | PRN
  Filled 2021-05-26: qty 6.7

## 2021-05-26 NOTE — Discharge Instructions (Addendum)
Your x-ray on today's visit show signs consistent with pneumonia. ? ?I have prescribed antibiotics for you to take, please take 1 tablet twice a day for the next 7 days. ? ?If you experience any worsening shortness of breath you will need to return to the emergency department. ? ?In addition you were given a refill for your inhaler, please use this as scheduled ?

## 2021-05-26 NOTE — ED Notes (Signed)
Pt care taken no complaints at this time. 

## 2021-05-26 NOTE — ED Triage Notes (Signed)
Pt arrived POV from work c/o an asthma attack. Pt states she was wheezing really bad at work and could not get her asthma under control. Pt is also endorsing pain across her shoulder blades.  ?

## 2021-05-26 NOTE — ED Provider Notes (Signed)
?[facility] ?Provider Note ? ? ?CSN: [id number] ?Arrival date & time: 05/26/21  1842 ? ?  ? ?History ? ?Chief Complaint  ?Patient presents with  ? Shortness of Breath  ? ? ?April Reed is a 56 y.o. female. ? ?56 y.o female with a PMH of Asthma presents to the ED with a chief complaint of shortness of breath while working today.  Does report ongoing history of asthma, does not follow-up with any specialist for this.  Has been using her inhaler but has not had any improvement in symptoms.  She also endorses a productive cough, this also is accompanied by some chest tightness that she feels like heaviness in the middle of her substernal area.  Does report feeling short of breath all the time, however this has worsened in the last couple of days.  She denies any history of CAD, no family history of CAD, no tree of blood clots.  No tobacco use, no leg swelling.  No prior intubations due to asthma exacerbation or hospitalizations. ? ?The history is provided by the patient and medical records.  ?Shortness of Breath ?Severity:  Mild ?Onset quality:  Gradual ?Duration:  3 days ?Associated symptoms: cough and sore throat   ?Associated symptoms: no abdominal pain, no chest pain, no fever, no headaches and no vomiting   ? ?  ? ?Home Medications ?Prior to Admission medications   ?Medication Sig Start Date End Date Taking? Authorizing Provider  ?albuterol (VENTOLIN HFA) 108 (90 Base) MCG/ACT inhaler Inhale 1-2 puffs into the lungs every 6 (six) hours as needed for wheezing or shortness of breath. 05/26/21 06/25/21 Yes Somnang Mahan, Beverley Fiedler, PA-C  ?doxycycline (VIBRAMYCIN) 100 MG capsule Take 1 capsule (100 mg total) by mouth 2 (two) times daily for 7 days. 05/26/21 06/02/21 Yes Lovelle Lema, Beverley Fiedler, PA-C  ?acetaminophen (TYLENOL) 325 MG tablet Take 325 mg by mouth every 6 (six) hours as needed. pain    [provider]  ?ipratropium (ATROVENT) 0.06 % nasal spray Place 2 sprays into both nostrils 4  (four) times daily as needed for rhinitis. 07/30/20   Coral Spikes, DO  ?loratadine (CLARITIN) 10 MG tablet Take 10 mg by mouth daily as needed. For allergies    [provider]  ?promethazine-dextromethorphan (PROMETHAZINE-DM) 6.25-15 MG/5ML syrup Take 5 mLs by mouth 4 (four) times daily as needed for cough. 07/30/20   Coral Spikes, DO  ?   ? ?Allergies    ?Latex   ? ?Review of Systems   ?Review of Systems  ?Constitutional:  Negative for chills and fever.  ?HENT:  Positive for sore throat.   ?Respiratory:  Positive for cough, chest tightness and shortness of breath.   ?Cardiovascular:  Negative for chest pain.  ?Gastrointestinal:  Negative for abdominal pain, nausea and vomiting.  ?Genitourinary:  Negative for flank pain.  ?Musculoskeletal:  Negative for back pain.  ?Skin:  Negative for pallor and wound.  ?Neurological:  Negative for light-headedness and headaches.  ?All other systems reviewed and are negative. ? ?Physical Exam ?Updated Vital Signs ?BP 117/66   Pulse 92   Temp 98.2 ?F (36.8 ?C)   Resp 15   Ht '5\' 3"'$  (1.6 m)   Wt 96.2 kg   LMP 03/28/2011   SpO2 100%   BMI 37.55 kg/m?  ?Physical Exam ?Vitals and nursing note reviewed.  ?Constitutional:   ?   General: She is not in acute distress. ?   Appearance: She is well-developed.  ?HENT:  ?  Head: Normocephalic and atraumatic.  ?   Mouth/Throat:  ?   Pharynx: No oropharyngeal exudate.  ?Eyes:  ?   Pupils: Pupils are equal, round, and reactive to light.  ?Cardiovascular:  ?   Rate and Rhythm: Regular rhythm.  ?   Heart sounds: Normal heart sounds.  ?Pulmonary:  ?   Effort: Pulmonary effort is normal. No respiratory distress.  ?   Breath sounds: Examination of the right-upper field reveals wheezing. Wheezing present.  ?Chest:  ?   Chest wall: No tenderness.  ?Abdominal:  ?   General: Bowel sounds are normal. There is no distension.  ?   Palpations: Abdomen is soft.  ?   Tenderness: There is no abdominal tenderness.  ?Musculoskeletal:     ?    General: No tenderness or deformity.  ?   Cervical back: Normal range of motion.  ?   Right lower leg: No edema.  ?   Left lower leg: No edema.  ?Skin: ?   General: Skin is warm and dry.  ?Neurological:  ?   Mental Status: She is alert and oriented to person, place, and time.  ? ? ?ED Results / Procedures / Treatments   ?Labs ?(all labs ordered are listed, but only abnormal results are displayed) ?Labs Reviewed  ?BASIC METABOLIC PANEL - Abnormal; Notable for the following components:  ?    Result Value  ? Creatinine, Ser 1.01 (*)   ? Calcium 8.5 (*)   ? All other components within normal limits  ?RESP PANEL BY RT-PCR (FLU A&B, COVID) ARPGX2  ?CBC  ?TROPONIN I (HIGH SENSITIVITY)  ?TROPONIN I (HIGH SENSITIVITY)  ? ? ?EKG ?None ? ?Radiology ?DG Chest 2 View ? ?Result Date: 05/26/2021 ?CLINICAL DATA:  Shortness of breath EXAM: CHEST - 2 VIEW COMPARISON:  10/30/2018 FINDINGS: Transverse diameter of heart is increased. Linear densities seen in the both parahilar regions. There is prominence of interstitial markings in the right parahilar region and right lower lung fields. Costophrenic angles are clear. There is no pneumothorax. Right hemidiaphragm is elevated. IMPRESSION: Linear densities in the parahilar regions may suggest scarring or subsegmental atelectasis. There is prominence of interstitial markings in the right mid and right lower lung fields suggesting scarring or interstitial pneumonia. There is no pleural effusion. Electronically Signed   By: Elmer Picker M.D.   On: 05/26/2021 20:15   ? ?Procedures ?Procedures  ? ? ?Medications Ordered in ED ?Medications  ?albuterol (VENTOLIN HFA) 108 (90 Base) MCG/ACT inhaler 2 puff (has no administration in time range)  ?albuterol (PROVENTIL) (2.5 MG/3ML) 0.083% nebulizer solution 5 mg (5 mg Nebulization Given 05/26/21 2026)  ? ? ?ED Course/ Medical Decision Making/ A&P ?  ?                        ?Medical Decision Making ?Amount and/or Complexity of Data  Reviewed ?Radiology: ordered. ? ?Risk ?Prescription drug management. ? ? ?This patient presents to the ED for concern of shortness of breath, this involves a number of treatment options, and is a complaint that carries with it a high risk of complications and morbidity.  The differential diagnosis includes asthma exacerbation, ACS, hernia. ? ? ?Co morbidities: ?Discussed in HPI ? ? ?Brief History: ? ?Patient with underlying history of asthma here with shortness of breath that began today while she was at work, no relief despite her inhaler use.  Does endorse a productive cough, feels like she sort of feels when she gets  pneumonia. ? ?EMR reviewed including pt PMHx, past surgical history and past visits to ER.  ? ?See HPI for more details ? ? ?Lab Tests: ? ?I ordered and independently interpreted labs.  The pertinent results include:   ? ?I personally reviewed all laboratory work and imaging. Metabolic panel without any acute abnormality specifically kidney function within normal limits and no significant electrolyte abnormalities. CBC without leukocytosis or significant anemia. ? ? ?Imaging Studies: ? ?Abnormal findings. I personally reviewed all imaging studies. Imaging notable for ? ?Linear densities in the parahilar regions may suggest scarring or  ?subsegmental atelectasis. There is prominence of interstitial  ?markings in the right mid and right lower lung fields suggesting  ?scarring or interstitial pneumonia. There is no pleural effusion.  ? ? ?Cardiac Monitoring: ? ?The patient was maintained on a cardiac monitor.  I personally viewed and interpreted the cardiac monitored which showed an underlying rhythm of: NSR ?EKG non-ischemic ? ? ?Medicines ordered: ? ?I ordered medication including nebulizer  for SOB ?Reevaluation of the patient after these medicines showed that the patient improved ?I have reviewed the patients home medicines and have made adjustments as neede ? ? ?Reevaluation: ? ?After the  interventions noted above I re-evaluated patient and found that they have :improved ? ? ?Social Determinants of Health: ? ?The patient's social determinants of health were a factor in the care of this patient ? ? ? ?Problem List / ED [place]

## 2021-05-26 NOTE — ED Provider Triage Note (Signed)
Emergency Medicine Provider Triage Evaluation Note ? ?April Reed , a 56 y.o. female  was evaluated in triage.  Pt complains of asthma exacerbation.  Patient reports earlier today she was working in a damp environment due to her job and she believes this triggered her asthma.  She then started to feel tension and heaviness in her chest.  Drove here for further evaluation and treatment.  Tried her albuterol inhaler this morning without relief.  ? ?No history of recent surgery, travel, hemoptysis, leg swelling or OCPs.  Never had DVT/PE ? ?Review of Systems  ?Positive: Shortness of breath and chest pressure ?Negative: Dizziness, syncope. ? ?Physical Exam  ?BP (!) 145/90 (BP Location: Right Arm)   Pulse 92   Temp 98.7 ?F (37.1 ?C) (Oral)   Resp 18   Ht '5\' 3"'$  (1.6 m)   Wt 96.2 kg   LMP 03/28/2011   SpO2 99%   BMI 37.55 kg/m?  ?Gen:   Awake, no distress   ?Resp:  Normal effort  ?MSK:   Moves extremities without difficulty  ?Other:  Regular rate and rhythm, wheezes in right upper lung field ? ?Medical Decision Making  ?Medically screening exam initiated at 7:13 PM.  Appropriate orders placed.  Ozella Puskarich was informed that the remainder of the evaluation will be completed by another provider, this initial triage assessment does not replace that evaluation, and the importance of remaining in the ED until their evaluation is complete. ? ? ?  ?Rhae Hammock, PA-C ?05/26/21 1914 ? ?

## 2022-08-13 IMAGING — CR DG CHEST 2V
2 series · 2 of 2 positions shown · non-contrast
Comparison: 10/30/2018

CLINICAL DATA: Shortness of breath

EXAM:
CHEST - 2 VIEW

[chest pa]
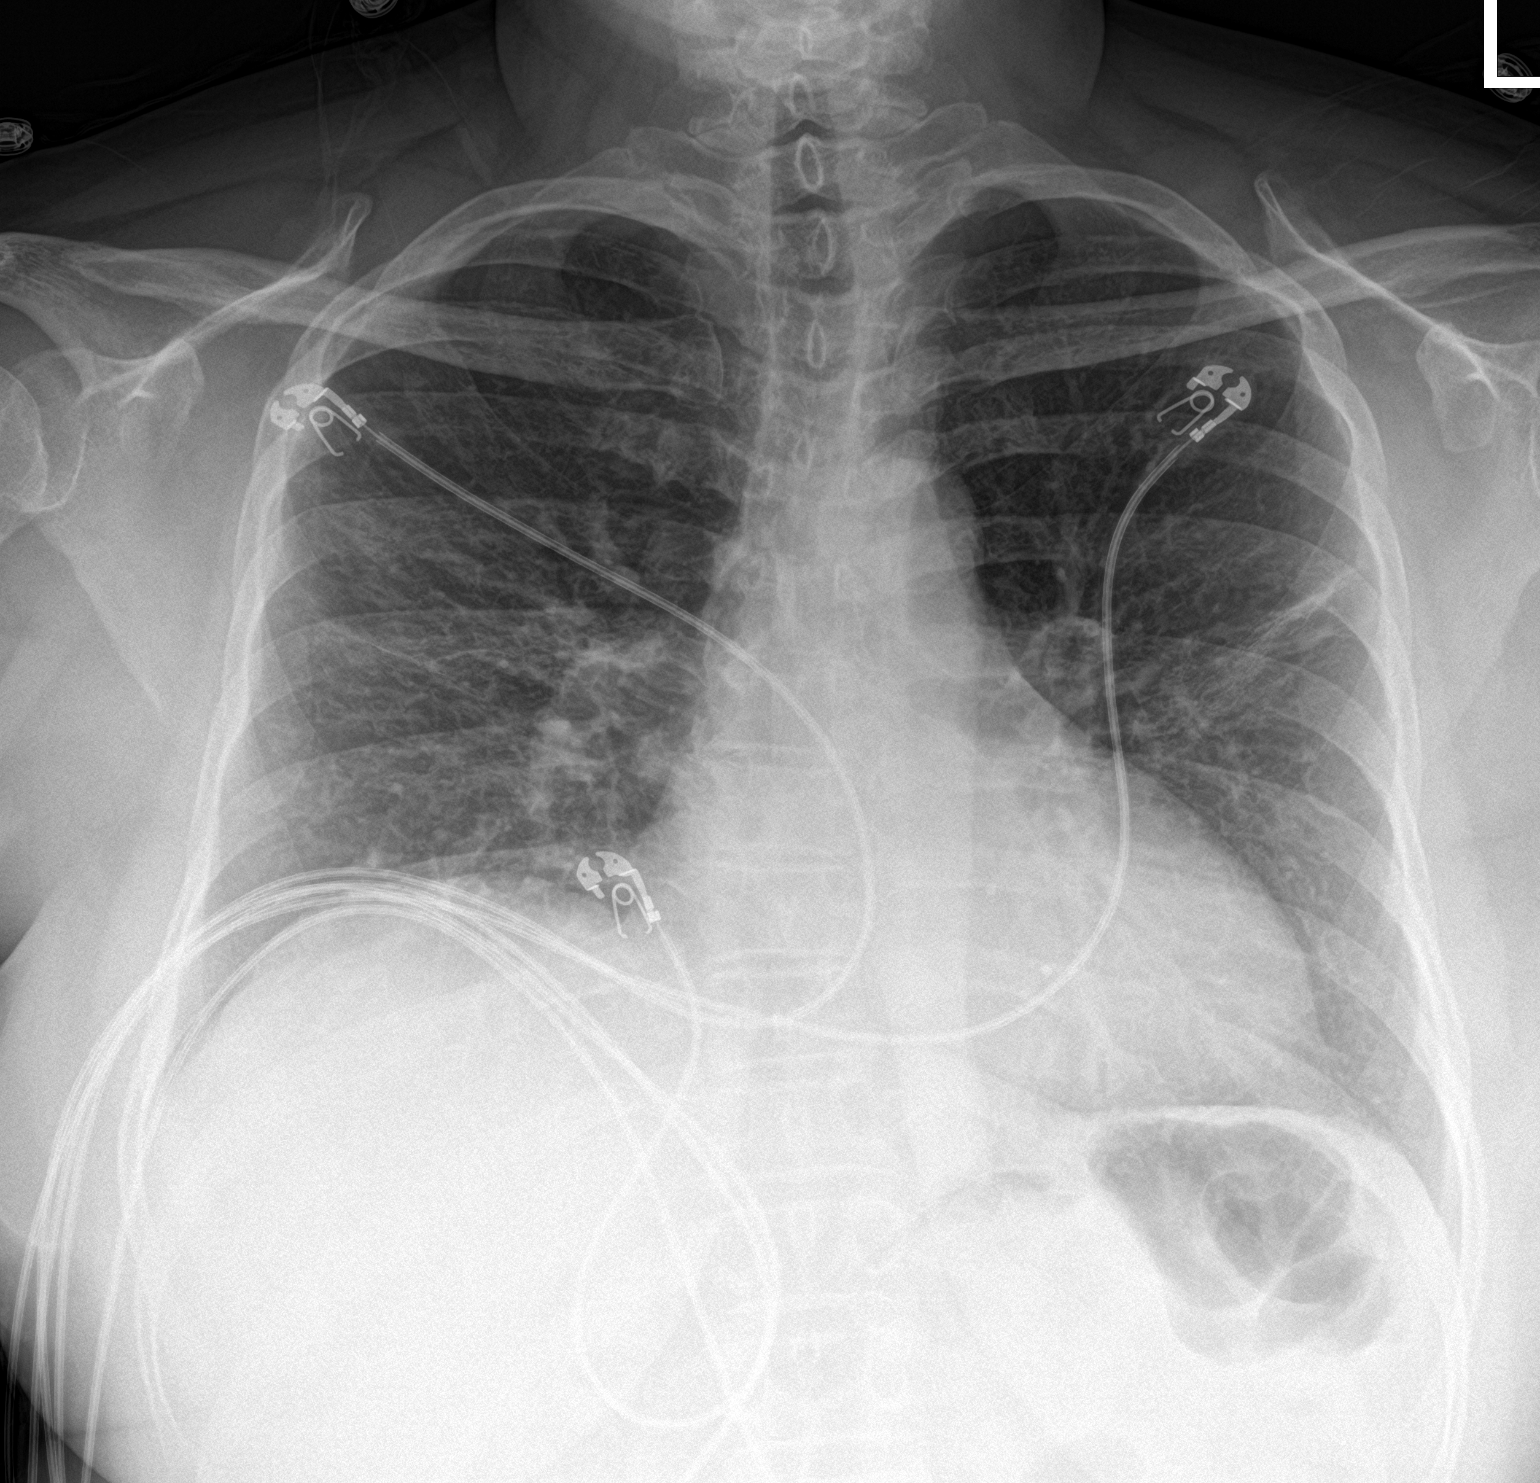

[chest lat]
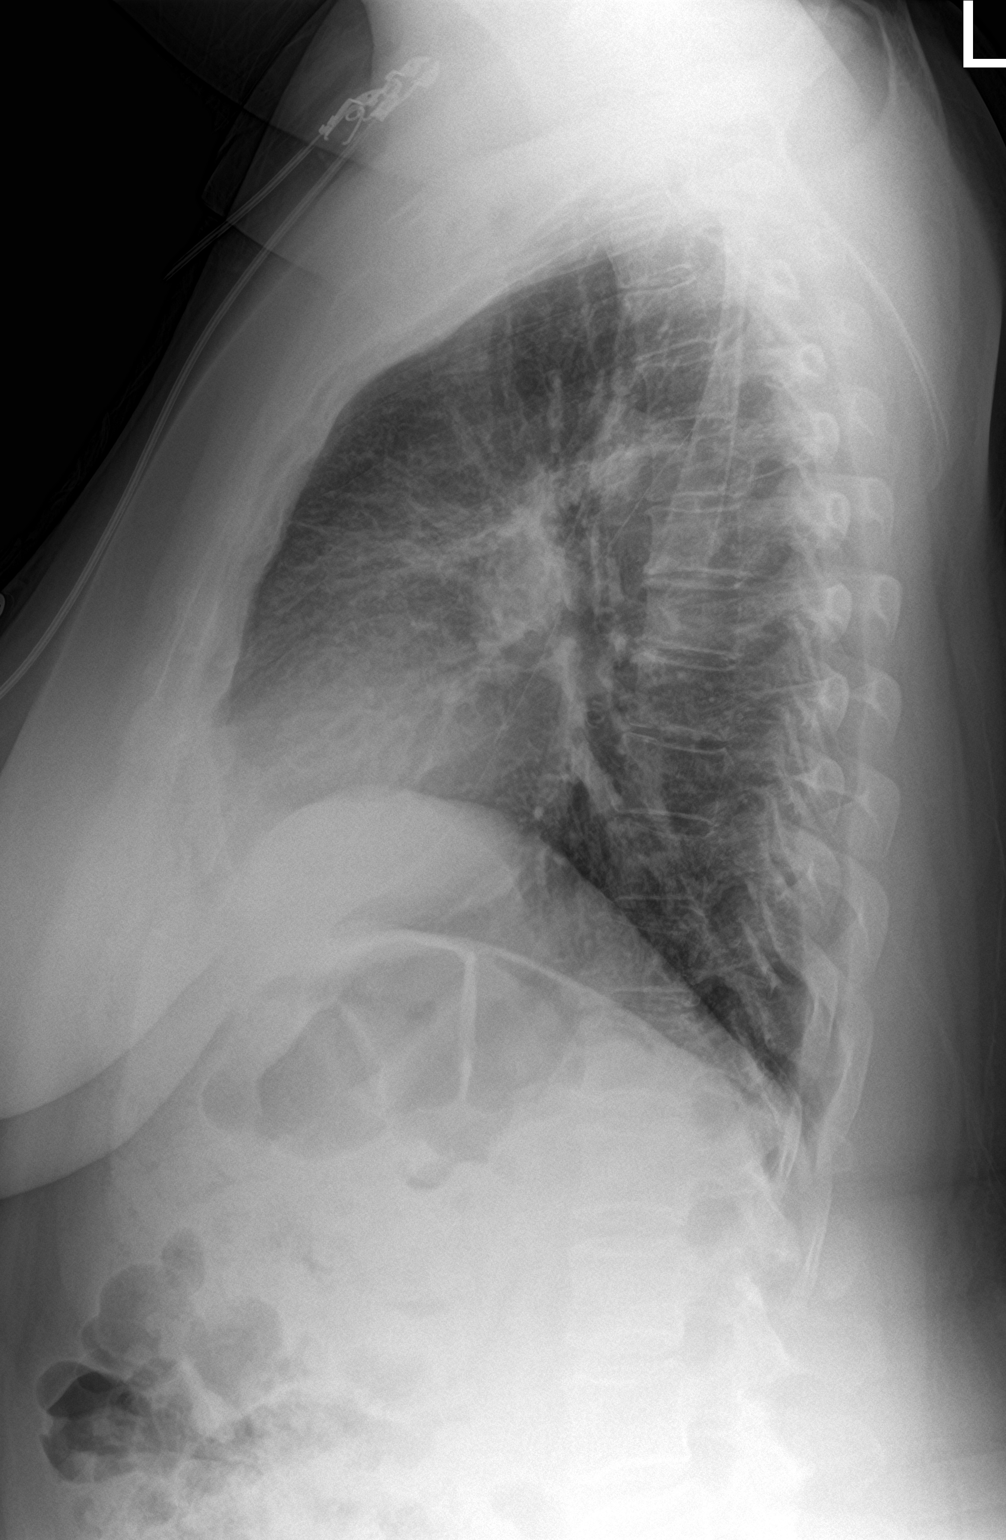

[2 of 2 positions shown; findings below may reference images not displayed]

FINDINGS: Transverse diameter of heart is increased. Linear densities seen in
the both parahilar regions. There is prominence of interstitial
markings in the right parahilar region and right lower lung fields.
Costophrenic angles are clear. There is no pneumothorax. Right
hemidiaphragm is elevated.
IMPRESSION: Linear densities in the parahilar regions may suggest scarring or
subsegmental atelectasis. There is prominence of interstitial
markings in the right mid and right lower lung fields suggesting
scarring or interstitial pneumonia. There is no pleural effusion.

## 2022-08-22 ENCOUNTER — Other Ambulatory Visit: Payer: Self-pay | Admitting: Family Medicine

## 2022-08-22 DIAGNOSIS — Z1231 Encounter for screening mammogram for malignant neoplasm of breast: Secondary | ICD-10-CM

## 2022-08-24 ENCOUNTER — Ambulatory Visit: Payer: Self-pay

## 2022-09-23 ENCOUNTER — Ambulatory Visit
Admission: RE | Admit: 2022-09-23 | Discharge: 2022-09-23 | Disposition: A | Discharge status: Home / Self Care | Payer: Managed Care, Other (non HMO) | Source: Ambulatory Visit | Attending: Family Medicine | Admitting: Family Medicine

## 2022-09-23 DIAGNOSIS — Z1231 Encounter for screening mammogram for malignant neoplasm of breast: Secondary | ICD-10-CM
